# Patient Record
Sex: Female | Born: 1975
Health system: Southern US, Community
[De-identification: ages and names within clinical notes are randomized; demographics above are authoritative.]

## PROBLEM LIST (undated history)

## (undated) DIAGNOSIS — H7292 Unspecified perforation of tympanic membrane, left ear: Secondary | ICD-10-CM

## (undated) DIAGNOSIS — Z8759 Personal history of other complications of pregnancy, childbirth and the puerperium: Secondary | ICD-10-CM

## (undated) DIAGNOSIS — R519 Headache, unspecified: Secondary | ICD-10-CM

## (undated) DIAGNOSIS — F32A Depression, unspecified: Secondary | ICD-10-CM

## (undated) DIAGNOSIS — F329 Major depressive disorder, single episode, unspecified: Secondary | ICD-10-CM

## (undated) DIAGNOSIS — N83209 Unspecified ovarian cyst, unspecified side: Secondary | ICD-10-CM

## (undated) DIAGNOSIS — H9313 Tinnitus, bilateral: Secondary | ICD-10-CM

## (undated) DIAGNOSIS — R51 Headache: Secondary | ICD-10-CM

## (undated) DIAGNOSIS — R002 Palpitations: Secondary | ICD-10-CM

## (undated) DIAGNOSIS — R102 Pelvic and perineal pain: Secondary | ICD-10-CM

## (undated) DIAGNOSIS — F419 Anxiety disorder, unspecified: Secondary | ICD-10-CM

## (undated) HISTORY — DX: Palpitations: R00.2

## (undated) HISTORY — DX: Anxiety disorder, unspecified: F41.9

## (undated) HISTORY — DX: Unspecified ovarian cyst, unspecified side: N83.209

## (undated) HISTORY — DX: Major depressive disorder, single episode, unspecified: F32.9

## (undated) HISTORY — DX: Headache, unspecified: R51.9

## (undated) HISTORY — PX: SALPINGECTOMY: SHX328

## (undated) HISTORY — DX: Headache: R51

## (undated) HISTORY — DX: Depression, unspecified: F32.A

## (undated) HISTORY — DX: Tinnitus, bilateral: H93.13

## (undated) HISTORY — DX: Personal history of other complications of pregnancy, childbirth and the puerperium: Z87.59

## (undated) HISTORY — PX: OTHER SURGICAL HISTORY: SHX169

## (undated) HISTORY — DX: Unspecified perforation of tympanic membrane, left ear: H72.92

## (undated) HISTORY — DX: Pelvic and perineal pain: R10.2

---

## 2002-03-28 DIAGNOSIS — H7292 Unspecified perforation of tympanic membrane, left ear: Secondary | ICD-10-CM

## 2002-03-28 HISTORY — DX: Unspecified perforation of tympanic membrane, left ear: H72.92

## 2003-03-29 HISTORY — PX: BACK SURGERY: SHX140

## 2003-09-03 ENCOUNTER — Encounter (INDEPENDENT_AMBULATORY_CARE_PROVIDER_SITE_OTHER): Payer: Self-pay | Admitting: *Deleted

## 2003-09-03 ENCOUNTER — Observation Stay (HOSPITAL_COMMUNITY): Admission: RE | Admit: 2003-09-03 | Discharge: 2003-09-04 | Payer: Self-pay | Admitting: Specialist

## 2004-03-28 DIAGNOSIS — Z8759 Personal history of other complications of pregnancy, childbirth and the puerperium: Secondary | ICD-10-CM

## 2004-03-28 HISTORY — DX: Personal history of other complications of pregnancy, childbirth and the puerperium: Z87.59

## 2004-07-29 ENCOUNTER — Ambulatory Visit: Payer: Self-pay | Admitting: Family Medicine

## 2004-10-20 ENCOUNTER — Ambulatory Visit: Payer: Self-pay | Admitting: Nurse Practitioner

## 2004-10-21 ENCOUNTER — Emergency Department: Payer: Self-pay | Admitting: Emergency Medicine

## 2004-10-25 ENCOUNTER — Ambulatory Visit: Payer: Self-pay | Admitting: Obstetrics & Gynecology

## 2005-07-21 ENCOUNTER — Ambulatory Visit: Payer: Self-pay | Admitting: Internal Medicine

## 2005-07-26 ENCOUNTER — Ambulatory Visit: Payer: Self-pay | Admitting: Internal Medicine

## 2007-10-09 ENCOUNTER — Ambulatory Visit: Payer: Self-pay | Admitting: Family Medicine

## 2008-03-28 HISTORY — PX: KNEE SURGERY: SHX244

## 2008-06-10 ENCOUNTER — Emergency Department: Payer: Self-pay | Admitting: Emergency Medicine

## 2008-06-11 ENCOUNTER — Emergency Department (HOSPITAL_COMMUNITY): Admission: EM | Admit: 2008-06-11 | Discharge: 2008-06-11 | Payer: Self-pay | Admitting: Emergency Medicine

## 2008-06-13 ENCOUNTER — Inpatient Hospital Stay (HOSPITAL_COMMUNITY): Admission: RE | Admit: 2008-06-13 | Discharge: 2008-06-15 | Payer: Self-pay | Admitting: Orthopaedic Surgery

## 2009-09-22 ENCOUNTER — Emergency Department: Payer: Self-pay | Admitting: Internal Medicine

## 2010-06-28 ENCOUNTER — Emergency Department: Payer: Self-pay | Admitting: Internal Medicine

## 2010-07-08 LAB — CBC
HCT: 36 % (ref 36.0–46.0)
RBC: 4.01 MIL/uL (ref 3.87–5.11)

## 2010-07-08 LAB — BASIC METABOLIC PANEL
BUN: 6 mg/dL (ref 6–23)
CO2: 27 mEq/L (ref 19–32)
Chloride: 102 mEq/L (ref 96–112)
Creatinine, Ser: 0.77 mg/dL (ref 0.4–1.2)
GFR calc Af Amer: >60 mL/min (ref >60)
GFR calc non Af Amer: >60 mL/min (ref >60)
Potassium: 3.9 mEq/L (ref 3.5–5.1)

## 2010-08-10 NOTE — Op Note (Signed)
Ashley, Thornton NO.:  [id number]   MEDICAL RECORD NO.:  [id number]          PATIENT TYPE:  INP   LOCATION:  5005                         FACILITY:  [facility]   PHYSICIAN:  Vanita Panda. Magnus Ivan, M.D.DATE OF BIRTH:  1975-11-01   DATE OF PROCEDURE:  06/13/2008  DATE OF DISCHARGE:                               OPERATIVE REPORT   PREOPERATIVE DIAGNOSES:  Right distal third tibia-fibula fracture with  posterior malleolar fracture.   POSTOPERATIVE DIAGNOSES:  Right distal third tibia-fibula fracture with  posterior malleolar fracture.   PROCEDURE:  1. Open reduction and internal fixation of right ankle posterior      malleolus fracture.  2. Intramedullary nail placement, right tibia.   IMPLANTS:  Smith & Nephew 10 x 35 tibial nail with 2 proximal and 2  distal interlocks as well as a 4.0, 46-mm cannulated screw in the ankle.   SURGEON:  Vanita Panda. Magnus Ivan, MD   ASSISTANT:  Wende Neighbors, PA-C   ANESTHESIA:  General.   ANTIBIOTICS:  One gram IV Ancef.   BLOOD LOSS:  100 mL.   COMPLICATIONS:  None.   INDICATIONS:  Briefly, Ms. Ashley Thornton is a 35 year old female who on  Tuesday of this week sustained some type of mechanical fall and twisting  injury injuring her right tibia.  She sustained a distal third tibia  fracture with a proximal fibular fracture, but there also was a  posterior malleolus fracture piece.  She was apparently seen at the  [facility] and placed in a splint and given  instructions to follow up with the [facility] in [place] next  day.  After an unsuccessful attempt apparently to get through the doors  of [facility] via phone call, the patient was frustrated and came  to the [facility] Emergency Room.  Since I was on an unassigned call, I  was consulted to see her.  I then saw her in my office yesterday and set  her up for surgery today, found her to be a very pleasant individual and  recommended  intramedullary nail placement in the tibia as well as the  fixation of posterior malleolar piece.  The risks and benefits of  surgery were explained to her at length and she agrees to proceed with  surgery.   PROCEDURE DESCRIPTION:  After informed consent was obtained, the  appropriate right leg was marked.  Ms. [name] was brought to the  operating room and placed supine on the operating table.  General  anesthesia was then obtained.  Her right leg was then prepped and draped  from the thigh down to the toes with DuraPrep and sterile drapes were  applied.  A time-out was called to identify the correct patient and  correct right leg and ankle.  First, my attention was turned to the  posterior malleolar piece.  I did not want this separating with IM nail,  so I was able to under direct fluoroscopic guidance place a cannulated  screw from anterior to posterior direction after making a small incision  at the level of the ankle.  This  was again performed under direct  fluoroscopic guidance and I was able to place a partially threaded screw  to capture this piece.  Next, my attention was turned to placing an  intramedullary nail.  An incision was made at the knee between the  inferior pole of patella and the tibial tubercle.  I dissected down to  the soft tissues and identified the patellar tendon.  It divided the  peritenon and then took a medial parapatellar approach to the knee.  With the  radiolucent triangle placed under the knee, a guide pin was  inserted in an antegrade fashion from the tip of the tibial plateau down  into the tibial shaft.  This was under fluoroscopic guidance as well.  An initiating reamer was able to open up the femoral canal.  I then  placed a guidewire down this pathway crossing the fracture site with  fracture reduced into the ankle.  From this standpoint, I then  sequentially reamed the tibia from 9 mm up to 11 mm and 5 mm increments.  A good chatter was  encountered in the bone and then I was able to make a  measurement off the guide pin and chose a Smith abd Nephew 10 x 35  tibial nail.  This nail was then placed over the guidewire crossing the  fracture site into the ankle and the guidewire was removed and the  fracture was held under almost anatomically reduced position.  Using the  outrigger guide, I then made 2 separate stab incisions medial and  laterally and placed 2 proximal interlocking screws.  Then distally  through a separate stab incision, I was able to place 2 distal  interlocking screws from medial to lateral position.  Again, this was  all performed under direct fluoroscopic guidance.  I put the knee  through a range of motion and the proximal aspect the nail was not  impinging the knee joint and the ankle showed full range of motion as  well.  We then copiously irrigated all of the wounds.  The arthrotomy  was closed with #1 Vicryl in the knee, followed by 0 Vicryl deep tissue,  2-0 Vicryl in subcutaneous tissue, and interrupted staples on this knee  incision at the skin as well as all the interlocked incisions.  Xeroform  followed by well-padded sterile dressing was placed throughout the leg  and the patient was awakened, extubated, and taken to the recovery room  in stable condition.  Postoperatively, she will be admitted for physical  therapy as well as elevation, ice, and further convalescence for pain  control and antibiotics.  Likely tomorrow, I will put her in a Cam  Walker and let her perform touch down to 25% weightbearing.      Vanita Panda. Magnus Ivan, M.D.  Electronically Signed     CYB/MEDQ  D:  06/13/2008  T:  06/14/2008  Job:  [id number]   cc:   ER [facility]/Head of  [facility]

## 2010-08-13 NOTE — Op Note (Signed)
NAME:  Ashley Thornton, Ashley Thornton                         ACCOUNT NO.:  [id number]   MEDICAL RECORD NO.:  [id number]                   PATIENT TYPE:  AMB   LOCATION:  DAY                                  FACILITY:  [facility]   PHYSICIAN:  Jene Every, M.D.                 DATE OF BIRTH:  09-09-1975   DATE OF PROCEDURE:  09/03/2003  DATE OF DISCHARGE:                                 OPERATIVE REPORT   PREOPERATIVE DIAGNOSES:  1. Spinal stenosis.  2. Herniated nucleus pulposus L4-5, left.   POSTOPERATIVE DIAGNOSES:  1. Spinal stenosis.  2. Herniated nucleus pulposus L4-5, left.   PROCEDURES PERFORMED:  1. Microdiskectomy L4-5, left.  2. Lateral recess decompression at L4-5 with foraminotomy of L4.  3. Decompression of the L5 nerve root.   ANESTHESIA:  General.   SURGEON:  Javier Docker, M.D.   ASSISTANT:  Roma Schanz, P.A.   BRIEF HISTORY AND INDICATIONS:  This is a 35 year old female with refractory  left lower extremity radicular pain, L5 nerve root distribution.  Positive  neural tension sign, __________HL weakness, and dysesthesias in the L5  dermatome, MRI indicating a large HNP, paracentral to the left with  compression of the L5 nerve root.  The patient had failed conservative  treatment including rest, anti-inflammatories, activity modification,  persistent neural tension signs, and neural deficit.  Operative intervention  is indicated for decompression of the L5 nerve root.  Risks and benefits  discussed including bleeding, infection, damage to neurovascular structures,  CSF leakage, epidural fibrosis, adjacent segment disease, etc.   TECHNIQUE:  The patient in supine position.  After the induction of adequate  general anesthesia and 1 g of Kefzol, she was placed prone on the [place]  frame.  All bony prominences well-padded.  Lumbar region is prepped and  draped in the usual sterile fashion.  Two 18 gauge spinal needles were used  to localize the 4-5 interspace  confirmed with x-ray.  An incision was made  from the spinous process of 4-5.  Subcutaneous tissue was dissected.  Electrocautery was utilized to achieve hemostasis.  The dorsolumbar fascia  identified and divided in line with the skin incision.  Paraspinous muscle  elevated from the lamina of 4 and 5.  McCullough retractor was placed,  Penfield 4 in the interlaminar space, second confirmatory radiograph  obtained.  Operating microscope was draped and brought into the surgical  field.  Hemilaminotomy of the caudad edge of 4 was performed with the 2 and  3 mm Kerrison.  Ligamentum flavum detached from the cephalad edge of 5 with  a straight curette.  Hemilaminotomy of the cephalad edge of 5 was performed  with a 2 and a 3 mm Kerrison, performing an L5 foraminotomy.  The L5 nerve  root was identified in the foramen and gently mobilized medially.  There was  significant tension compressing the lateral recess of that.  Extensive  epidural  venous plexus was lysed with bipolar electrocautery.  HNP was  noted, and an annulotomy was performed, and copious large fragments of disk  material was removed from the subannular space.  This was mobilized with a  hockey-stick, Epstein, and retrieved piecemeal with an upbiting and a  straight pituitary and a micropituitary as well.  Following this full  diskectomy, I then examined the axilla beneath the nerve root in the foramen  of 4 and 5, and they were all found to be widely patent without residual  disk herniation.  I then copiously irrigated the disk space, inspected it,  and saw no evidence of CSF leakage or active bleeding.  There was good  excursion of the root at least 1 cm medial to the pedicle without tension.   Next, retractor and instrumentation was removed.  Paraspinous muscles  inspected with no evidence of active bleeding.  Dorsolumbar fascia  reapproximated with #1 Vicryl interrupted figure-of-eight sutures,  subcutaneous tissue  reapproximated with 2-0 Vicryl simple sutures.  Skin was  reapproximated with 4-0 subcuticular Prolene.  Wound was reinforced with  Steri-Strips.  Sterile dressing applied, placed supine on the hospital bed,  extubated without difficulty, and transported to the recovery room in  satisfactory condition.   The patient tolerated the procedure well with no apparent complications.  Minimal blood loss.                                               Jene Every, M.D.    [name]  D:  09/03/2003  T:  09/03/2003  Job:  [id number]

## 2010-08-13 NOTE — Discharge Summary (Signed)
NAMEDEAJA, RIZO NO.:  [id number]   MEDICAL RECORD NO.:  [id number]          PATIENT TYPE:  INP   LOCATION:  5005                         FACILITY:  [facility]   PHYSICIAN:  Vanita Panda. Magnus Ivan, M.D.DATE OF BIRTH:  07/11/1975   DATE OF ADMISSION:  06/13/2008  DATE OF DISCHARGE:  06/15/2008                               DISCHARGE SUMMARY   ADMITTING DIAGNOSIS:  Right tibia shaft/fibula shaft fracture.   DISCHARGE DIAGNOSIS:  Right tibia-fibular fracture.   HOSPITAL COURSE:  Ms. Ashley Thornton is a 35 year old female who was admitted  on June 13, 2008 after having a mechanical fall.  She was actually  originally seen on Tuesday of the week when she was admitted and she  sustained a distal third tibia fracture with a proximal fibular  fracture.  There was also a small posterior malleolar fracture piece.  She was apparently seen at [facility] and placed  in a splint and given instructions to followup with the [facility]  in [place] the next day.  After an unsuccessful attempt apparently to  get through the doors of the [facility] via phone call, the patient  was frustrated and came to the [facility] Emergency Room.  Since I was  on an unassigned call, I was consulted to see her.  I then saw her in my  office yesterday, the day before admission and set her up for surgery  today.  She was found to be a very pleasant individual and I recommended  intramedullary nailing of the tibia as well as fixation of the posterior  malleolar piece.  The risks and benefits of surgery were explained to  her and she agreed to proceed with surgery.  On the day of admission,  she was taken to the operating room and an intramedullary nail was  placed without complications.  Also, I had to place a screw in the  distal tibia so as to not displace the posterior malleolus piece.  For  details of description of the operation, please see dictated operative  note in the patient's medical record.   Following surgery, the patient was admitted to regular orthopedic floor  bed and had an uneventful hospital stay.  She was placed in a Cam walker  and instructed for touchdown weightbearing in the Cam walker.  After  being seen and cleared by Physical Therapy, she was transitioned no oral  pain medicine as well as oral diet and she tolerated this well.   DISPOSITION:  To home.   DISCHARGE MEDICATIONS:  1. Oxycodone.  2. Phenergan.  3. Flexeril.  4. Darvocet.   DISCHARGE INSTRUCTIONS:  While she is at home, she will continue to work  on touchdown weightbearing in her Cam walker.  A followup appointment  will be established in my office in 2 weeks.      Vanita Panda. Magnus Ivan, M.D.  Electronically Signed     CYB/MEDQ  D:  08/03/2008  T:  08/03/2008  Job:  [id number]

## 2011-02-04 ENCOUNTER — Emergency Department: Payer: Self-pay | Admitting: Emergency Medicine

## 2012-11-14 ENCOUNTER — Emergency Department: Payer: Self-pay | Admitting: Emergency Medicine

## 2014-02-07 ENCOUNTER — Encounter: Payer: Self-pay | Admitting: *Deleted

## 2014-02-10 ENCOUNTER — Ambulatory Visit (INDEPENDENT_AMBULATORY_CARE_PROVIDER_SITE_OTHER): Payer: 59 | Admitting: Cardiovascular Disease

## 2014-02-10 ENCOUNTER — Encounter (INDEPENDENT_AMBULATORY_CARE_PROVIDER_SITE_OTHER): Payer: Self-pay

## 2014-02-10 ENCOUNTER — Encounter: Payer: Self-pay | Admitting: Cardiovascular Disease

## 2014-02-10 VITALS — BP 122/80 | HR 72 | Ht 71.0 in | Wt 178.8 lb

## 2014-02-10 DIAGNOSIS — R002 Palpitations: Secondary | ICD-10-CM

## 2014-02-10 DIAGNOSIS — R0789 Other chest pain: Secondary | ICD-10-CM

## 2014-02-10 DIAGNOSIS — Z720 Tobacco use: Secondary | ICD-10-CM

## 2014-02-10 NOTE — Patient Instructions (Signed)
Your physician has recommended that you wear a holter monitor. Holter monitors are medical devices that record the heart's electrical activity. Doctors most often use these monitors to diagnose arrhythmias. Arrhythmias are problems with the speed or rhythm of the heartbeat. The monitor is a small, portable device. You can wear one while you do your normal daily activities. This is usually used to diagnose what is causing palpitations/syncope (passing out).  Your physician has requested that you have an exercise tolerance test. For further information please visit [email or web address]. Please also follow instruction sheet, as given.  Your physician recommends that you schedule a follow-up appointment in:  As needed with Dr. Kirke CorinArida

## 2014-02-10 NOTE — Assessment & Plan Note (Signed)
I had a prolonged discussion with her about the importance of smoking cessation. 

## 2014-02-10 NOTE — Assessment & Plan Note (Signed)
Her symptoms are suggestive of premature beats. Physical exam is unremarkable with no cardiac murmurs. EKG is normal. I requested a 48-hour Holter monitor for evaluation. I strongly advised her to cut down on caffeine intake. Some of her symptoms might also be triggered by anxiety.

## 2014-02-10 NOTE — Progress Notes (Signed)
  Primary care physician: Dr. Nicholaus CorollaFarah Khan  HPI  This is a pleasant 38 year old female who was referred for evaluation of chest pain and palpitations. She has no previous cardiac history and reports no chronic medical conditions other than tobacco use. She currently smokes 2 cigars per day. She has been experiencing intermittent episodes of palpitations and skipping in her heart since March. This is mostly noticeable when she is under stress and not with physical activities. She denies any dizziness, syncope or presyncope. She also complains of substernal chest tightness which usually happens when she is under stress but not with physical activities. She has chronic exertional dyspnea with no recent worsening. She has no family history of premature coronary artery disease, arrhythmia or sudden death. She consumes one cup of coffee a day and at least 2  20 ounce bottles of [facility] Dew.   Allergies  Allergen Reactions  . Codeine     SOB, SWEATING, FAINT     No current outpatient prescriptions on file prior to visit.   No current facility-administered medications on file prior to visit.     Past Medical History  Diagnosis Date  . Anxiety   . Heart palpitations   . Ovarian cyst     right  . Pelvic pain in female   . Depression   . Headache      No past surgical history on file.   No family history on file.   History   Social History  . Marital Status: Single    Spouse Name: N/A    Number of Children: N/A  . Years of Education: N/A   Occupational History  . Not on file.   Social History Main Topics  . Smoking status: Current Some Day Smoker  . Smokeless tobacco: Not on file  . Alcohol Use: No  . Drug Use: No  . Sexual Activity: Not on file   Other Topics Concern  . Not on file   Social History Narrative     ROS A 10 point review of system was performed. It is negative other than that mentioned in the history of present illness.   PHYSICAL EXAM   BP  122/80 mmHg  Pulse 72  Ht 5\' 11"  (1.803 m)  Wt 178 lb 12.8 oz (81.103 kg)  BMI 24.95 kg/m2 Constitutional: She is oriented to person, place, and time. She appears well-developed and well-nourished. No distress.  HENT: No nasal discharge.  Head: Normocephalic and atraumatic.  Eyes: Pupils are equal and round. No discharge.  Neck: Normal range of motion. Neck supple. No JVD present. No thyromegaly present.  Cardiovascular: Normal rate, regular rhythm, normal heart sounds. Exam reveals no gallop and no friction rub. No murmur heard.  Pulmonary/Chest: Effort normal and breath sounds normal. No stridor. No respiratory distress. She has no wheezes. She has no rales. She exhibits no tenderness.  Abdominal: Soft. Bowel sounds are normal. She exhibits no distension. There is no tenderness. There is no rebound and no guarding.  Musculoskeletal: Normal range of motion. She exhibits no edema and no tenderness.  Neurological: She is alert and oriented to person, place, and time. Coordination normal.  Skin: Skin is warm and dry. No rash noted. She is not diaphoretic. No erythema. No pallor.  Psychiatric: She has a normal mood and affect. Her behavior is normal. Judgment and thought content normal.    ZOX:WRUEAVEKG:normal sinus rhythm with no significant ST or T wave changes.   ASSESSMENT AND PLAN

## 2014-02-10 NOTE — Assessment & Plan Note (Signed)
Her chest pain is overall atypical and she is a smoker. I requested a treadmill stress test for evaluation.

## 2014-03-06 ENCOUNTER — Telehealth: Payer: Self-pay | Admitting: *Deleted

## 2014-03-06 NOTE — Telephone Encounter (Signed)
LVM to inform patient her holter monitor showed: NSR with sinus tachycardia  No significant arrhythmia  Per Dr. Kirke CorinArida

## 2014-03-07 ENCOUNTER — Encounter: Payer: 59 | Admitting: Cardiovascular Disease

## 2014-03-10 NOTE — Telephone Encounter (Signed)
Reviewed results with patient. 

## 2014-03-13 ENCOUNTER — Other Ambulatory Visit: Payer: Self-pay

## 2014-03-13 ENCOUNTER — Ambulatory Visit (INDEPENDENT_AMBULATORY_CARE_PROVIDER_SITE_OTHER): Payer: 59

## 2014-03-13 ENCOUNTER — Ambulatory Visit (INDEPENDENT_AMBULATORY_CARE_PROVIDER_SITE_OTHER): Payer: 59 | Admitting: Cardiovascular Disease

## 2014-03-13 DIAGNOSIS — R002 Palpitations: Secondary | ICD-10-CM

## 2014-03-13 DIAGNOSIS — R0789 Other chest pain: Secondary | ICD-10-CM

## 2014-03-13 NOTE — Procedures (Signed)
   Treadmill Stress test  Indication: Atypical chest pain.  Baseline Data:  Resting EKG shows NSR with rate of 101 bpm, nonspecific T wave changes Resting blood pressure of 129/81 mm Hg Stand bruce protocal was used.  Exercise Data:  Patient exercised for 9 min 41 sec,  Peak heart rate of 160 bpm.  This was 86 % of the maximum predicted heart rate. No symptoms of chest pain or lightheadedness were reported at peak stress or in recovery.  Peak Blood pressure recorded was 195/76 Maximal work level: 11 METs.  Heart rate at 3 minutes in recovery was 100 bpm. BP response: Hypertensive HR response: Normal  EKG with Exercise: Sinus tachycardia with no significant ST changes  FINAL IMPRESSION: Normal exercise stress test. No significant EKG changes concerning for ischemia. Good exercise tolerance.  Recommendation: The chest pain is likely noncardiac.

## 2014-03-13 NOTE — Patient Instructions (Signed)
Normal stress test

## 2015-08-03 ENCOUNTER — Emergency Department
Admission: EM | Admit: 2015-08-03 | Discharge: 2015-08-03 | Disposition: A | Discharge status: Home / Self Care | Payer: Self-pay | Attending: Emergency Medicine | Admitting: Emergency Medicine

## 2015-08-03 ENCOUNTER — Encounter: Payer: Self-pay | Admitting: Emergency Medicine

## 2015-08-03 DIAGNOSIS — M545 Low back pain, unspecified: Secondary | ICD-10-CM

## 2015-08-03 DIAGNOSIS — F329 Major depressive disorder, single episode, unspecified: Secondary | ICD-10-CM | POA: Insufficient documentation

## 2015-08-03 DIAGNOSIS — F17200 Nicotine dependence, unspecified, uncomplicated: Secondary | ICD-10-CM | POA: Insufficient documentation

## 2015-08-03 LAB — URINALYSIS COMPLETE WITH MICROSCOPIC (ARMC ONLY)
BILIRUBIN URINE: NEGATIVE
Bacteria, UA: NONE SEEN
GLUCOSE, UA: NEGATIVE mg/dL
Hgb urine dipstick: NEGATIVE
KETONES UR: NEGATIVE mg/dL
Leukocytes, UA: NEGATIVE
Nitrite: NEGATIVE
PROTEIN: NEGATIVE mg/dL
Specific Gravity, Urine: 1.02 (ref 1.005–1.030)
pH: 6 (ref 5.0–8.0)

## 2015-08-03 LAB — POCT PREGNANCY, URINE: Preg Test, Ur: NEGATIVE

## 2015-08-03 MED ORDER — TRAMADOL HCL 50 MG PO TABS: 50.0000 mg | ORAL_TABLET | Freq: Four times a day (QID) | ORAL | Status: DC | PRN

## 2015-08-03 MED ORDER — CYCLOBENZAPRINE HCL 10 MG PO TABS: 10.0000 mg | ORAL_TABLET | Freq: Three times a day (TID) | ORAL | Status: DC | PRN

## 2015-08-03 NOTE — ED Notes (Signed)
Reports right lower back pain.  Hx of the same.  Ambulates well. States its hard to sleeping at night bc of the pain

## 2015-08-03 NOTE — ED Notes (Signed)
States she developed right lower back pain couple of days ago  States pain is radiating into right leg  Denies any injury  Ambulates well but is unable to sleep d/t pain

## 2015-08-03 NOTE — ED Provider Notes (Signed)
[facility] Emergency Department Provider Note ____________________________________________  Time seen: Approximately 6:08 PM  I have reviewed the triage vital signs and the nursing notes.   HISTORY  Chief Complaint Back Pain    HPI Ashley Thornton is a 40 y.o. female who presents to the emergency department for evaluation of right mid/lower back pain for the past few days. She reports having a history of chronic back pain, but this is different. She states that it is preventing her from sleeping. She states the pain is now starting to come around to her abdomen. She reports that she has a strange back pain every month just before starting her menstrual cycle and this may be the cause of the current pain, however it is different this month than last. She has had no relief with ibuprofen.   Past Medical History  Diagnosis Date  . Anxiety   . Heart palpitations   . Ovarian cyst     right  . Pelvic pain in female   . Depression   . Headache     Patient Active Problem List   Diagnosis Date Noted  . Palpitations 02/10/2014  . Other chest pain 02/10/2014  . Tobacco use 02/10/2014    History reviewed. No pertinent past surgical history.  Current Outpatient Rx  Name  Route  Sig  Dispense  Refill  . cyclobenzaprine (FLEXERIL) 10 MG tablet   Oral   Take 1 tablet (10 mg total) by mouth 3 (three) times daily as needed for muscle spasms.   30 tablet   0   . ibuprofen (V-R IBUPROFEN JR) 100 MG tablet   Oral   Take 200 mg by mouth as needed.         . traMADol (ULTRAM) 50 MG tablet   Oral   Take 1 tablet (50 mg total) by mouth every 6 (six) hours as needed.   12 tablet   0     Allergies Codeine  No family history on file.  Social History Social History  Substance Use Topics  . Smoking status: Current Some Day Smoker  . Smokeless tobacco: None  . Alcohol Use: No    Review of Systems Constitutional: No recent illness. Cardiovascular:  Denies chest pain or palpitations. Respiratory: Denies shortness of breath. Musculoskeletal: Pain in right mid/lower back. Skin: Negative for rash, wound, lesion. Neurological: Negative for focal weakness or numbness.  ____________________________________________   PHYSICAL EXAM:  VITAL SIGNS: ED Triage Vitals  Enc Vitals Group     BP 08/03/15 1659 131/86 mmHg     Pulse Rate 08/03/15 1659 72     Resp 08/03/15 1659 18     Temp 08/03/15 1659 98.2 F (36.8 C)     Temp Source 08/03/15 1659 Oral     SpO2 08/03/15 1659 100 %     Weight 08/03/15 1659 175 lb (79.379 kg)     Height 08/03/15 1659 5\' 7"  (1.702 m)     Head Cir --      Peak Flow --      Pain Score 08/03/15 1659 8     Pain Loc --      Pain Edu? --      Excl. in GC? --     Constitutional: Alert and oriented. Well appearing and in no acute distress. Eyes: Conjunctivae are normal. EOMI. Head: Atraumatic. Neck: No stridor.  Respiratory: Normal respiratory effort.   Musculoskeletal: Full ROM throughout. No CVA tenderness. Neurologic:  Normal speech and language. No  gross focal neurologic deficits are appreciated. Speech is normal. No gait instability. Skin:  Skin is warm, dry and intact. Atraumatic. Psychiatric: Mood and affect are normal. Speech and behavior are normal.  ____________________________________________   LABS (all labs ordered are listed, but only abnormal results are displayed)  Labs Reviewed  URINALYSIS COMPLETEWITH MICROSCOPIC ([facility] ONLY) - Abnormal; Notable for the following:    Color, Urine YELLOW (*)    APPearance CLEAR (*)    Squamous Epithelial / LPF 6-30 (*)    All other components within normal limits  POC URINE PREG, ED  POCT PREGNANCY, URINE   ____________________________________________  RADIOLOGY  Not indicated ____________________________________________   PROCEDURES  Procedure(s) performed: None   ____________________________________________   INITIAL IMPRESSION /  ASSESSMENT AND PLAN / ED COURSE  Pertinent labs & imaging results that were available during my care of the patient were reviewed by me and considered in my medical decision making (see chart for details).  Patient was given prescriptions for Flexeril and tramadol. She was encouraged to schedule a follow-up appointment with the OB/GYN of her choice to further investigate the pain in relation to her menstrual cycles. She was advised to return to the emergency department for symptoms that change or worsen if she is unable schedule an appointment. ____________________________________________   FINAL CLINICAL IMPRESSION(S) / ED DIAGNOSES  Final diagnoses:  Right-sided low back pain without sciatica       Chinita Pester, FNP 08/04/15 0017  Loleta Rose, MD 08/04/15 [phone]

## 2015-08-03 NOTE — Discharge Instructions (Signed)

## 2015-08-03 NOTE — ED Notes (Signed)
Lab called to check on UA results - they stated the results should be seen in 2 minutes

## 2016-05-16 DIAGNOSIS — M546 Pain in thoracic spine: Secondary | ICD-10-CM | POA: Diagnosis not present

## 2016-07-15 ENCOUNTER — Ambulatory Visit (INDEPENDENT_AMBULATORY_CARE_PROVIDER_SITE_OTHER): Payer: Self-pay | Admitting: Nurse Practitioner

## 2016-07-15 ENCOUNTER — Encounter: Payer: Self-pay | Admitting: Nurse Practitioner

## 2016-07-15 VITALS — BP 116/83 | HR 76 | Temp 99.1°F | Resp 16 | Ht 71.0 in | Wt 192.0 lb

## 2016-07-15 DIAGNOSIS — M542 Cervicalgia: Secondary | ICD-10-CM | POA: Diagnosis not present

## 2016-07-15 MED ORDER — GABAPENTIN 100 MG PO CAPS: 100.0000 mg | ORAL_CAPSULE | Freq: Three times a day (TID) | ORAL | 1 refills | Status: DC
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
## 2016-07-15 NOTE — Patient Instructions (Addendum)
Ashley Thornton, Thank you for coming in to clinic today.  1. Environmental health practitioner: contact them for the proper stool for your computer workstation.  I can complete paperwork if required 2. START gabapentin 100 mg.  Take 1 tablet daily until you know how it affects you.  It may cause drowsiness, so take it when you are not driving.  Increase by 1 tablet daily as needed until a maximum of 3 tablets daily. 3. Consider occupational therapy for treatment of neck pain. 4.  Start taking Tylenol extra strength 1 to 2 tablets every 6-8 hours for aches or fever/chills for next few days as needed.  Do not take more than 3,000 mg in 24 hours from all medicines.  May take naproxen sodium as well if tolerated 440 mg every 12 hours as needed.  Alternate dosing in the same day is ok. - Use heat and ice.  Apply this for 15 minutes at a time 6-8 times per day.   - Muscle rub with lidocaine.  Avoid using this with heat and ice to avoid burns.  Please schedule a follow-up appointment with [name], AGNP in 1 month.   If you have any other questions or concerns, please feel free to call the clinic or send a message through MyChart. You may also schedule an earlier appointment if necessary.  [name], DNP, AGNP-BC Adult Gerontology Nurse Practitioner [facility], [facility]   Musculoskeletal Pain Musculoskeletal pain is muscle and bone aches and pains. This pain can occur in any part of the body. Follow these instructions at home:  Only take medicines for pain, discomfort, or fever as told by your health care provider.  You may continue all activities unless the activities cause more pain. When the pain lessens, slowly resume normal activities. Gradually increase the intensity and duration of the activities or exercise.  During periods of severe pain, bed rest may be helpful. Lie or sit in any position that is comfortable, but get out of bed and walk around at least every several hours.  If  directed, put ice on the injured area.  Put ice in a plastic bag.  Place a towel between your skin and the bag.  Leave the ice on for 20 minutes, 2-3 times a day. Contact a health care provider if:  Your pain is getting worse.  Your pain is not relieved with medicines.  You lose function in the area of the pain if the pain is in your arms, legs, or neck. This information is not intended to replace advice given to you by your health care provider. Make sure you discuss any questions you have with your health care provider. Document Released: 03/14/2005 Document Revised: 08/25/2015 Document Reviewed: 11/16/2012 Elsevier Interactive Patient Education  2017 [employer].

## 2016-07-18 NOTE — Progress Notes (Signed)
I have reviewed this encounter including the documentation in this note and/or discussed this patient with the provider, Lauren Kennedy, AGPCNP-BC. I am certifying that I agree with the content of this note as supervising physician.  Alexander Karamalegos, DO [facility] 07/18/2016, 3:04 PM 

## 2016-08-15 ENCOUNTER — Encounter: Payer: 59 | Admitting: Nurse Practitioner

## 2016-10-31 ENCOUNTER — Emergency Department
Admission: EM | Admit: 2016-10-31 | Discharge: 2016-10-31 | Disposition: A | Discharge status: Home / Self Care | Payer: 59 | Attending: Emergency Medicine | Admitting: Emergency Medicine

## 2016-10-31 ENCOUNTER — Emergency Department: Payer: 59

## 2016-10-31 DIAGNOSIS — Y999 Unspecified external cause status: Secondary | ICD-10-CM | POA: Diagnosis not present

## 2016-10-31 DIAGNOSIS — M542 Cervicalgia: Secondary | ICD-10-CM | POA: Diagnosis not present

## 2016-10-31 DIAGNOSIS — Z79899 Other long term (current) drug therapy: Secondary | ICD-10-CM | POA: Diagnosis not present

## 2016-10-31 DIAGNOSIS — W2203XA Walked into furniture, initial encounter: Secondary | ICD-10-CM | POA: Insufficient documentation

## 2016-10-31 DIAGNOSIS — S161XXA Strain of muscle, fascia and tendon at neck level, initial encounter: Secondary | ICD-10-CM | POA: Diagnosis not present

## 2016-10-31 DIAGNOSIS — F17210 Nicotine dependence, cigarettes, uncomplicated: Secondary | ICD-10-CM | POA: Diagnosis not present

## 2016-10-31 DIAGNOSIS — Y929 Unspecified place or not applicable: Secondary | ICD-10-CM | POA: Insufficient documentation

## 2016-10-31 DIAGNOSIS — Y939 Activity, unspecified: Secondary | ICD-10-CM | POA: Diagnosis not present

## 2016-10-31 DIAGNOSIS — M62838 Other muscle spasm: Secondary | ICD-10-CM | POA: Diagnosis not present

## 2016-10-31 DIAGNOSIS — S199XXA Unspecified injury of neck, initial encounter: Secondary | ICD-10-CM | POA: Diagnosis present

## 2016-10-31 MED ORDER — CYCLOBENZAPRINE HCL 10 MG PO TABS
5.0000 mg | ORAL_TABLET | Freq: Once | ORAL | Status: AC
  Administered 2016-10-31: 5 mg via ORAL
  Filled 2016-10-31: qty 1

## 2016-10-31 MED ORDER — PREDNISONE 10 MG (21) PO TBPK: ORAL_TABLET | ORAL | 0 refills | Status: DC

## 2016-10-31 MED ORDER — CYCLOBENZAPRINE HCL 10 MG PO TABS: 10.0000 mg | ORAL_TABLET | Freq: Three times a day (TID) | ORAL | 0 refills | Status: DC | PRN

## 2016-10-31 MED ORDER — DEXAMETHASONE SODIUM PHOSPHATE 10 MG/ML IJ SOLN
10.0000 mg | Freq: Once | INTRAMUSCULAR | Status: AC
  Administered 2016-10-31: 10 mg via INTRAMUSCULAR
  Filled 2016-10-31: qty 1

## 2016-10-31 NOTE — Discharge Instructions (Signed)
Take medication as prescribed. Return to emergency department if symptoms worsen and follow-up with PCP as needed.   °

## 2016-10-31 NOTE — ED Notes (Signed)
Pt states Thursday she developed right neck pain, states since Thursday her neck pain has now spread to the left side of her shoulders, awake and alert in no acute distress

## 2016-10-31 NOTE — ED Provider Notes (Signed)
[facility] Emergency Department Provider Note   ____________________________________________   I have reviewed the triage vital signs and the nursing notes.   HISTORY  Chief Complaint Neck Pain    HPI Ashley Thornton is a 41 y.o. female presents to the emergency department with bilateral posterior neck pain she describes as sharp and spasming. Patient reports she woke up with the pain 4 days ago and she describes it as a "crink in her neck". Patient also reports hitting her head 3 days ago on a piece of furniture and is unsure if this is related to current symptoms. Patient denies any loss of consciousness or neurological symptoms related to hitting her head. Patient reports increased symptoms with cervical rotation and extension. Patient denies any radicular symptoms associated with the above symptoms. Patient reports a history of osteoarthritis of the cervical spine. Patient denies fever, chills, headache, vision changes, chest pain, chest tightness, shortness of breath, abdominal pain, nausea and vomiting.  Past Medical History:  Diagnosis Date  . Anxiety   . Depression   . Headache   . Heart palpitations   . History of ectopic pregnancy 2006   single fallopian tube remains  . Ovarian cyst    right  . Pelvic pain in female   . Ruptured ear drum, left 2004   hearing returned  . Tinnitus aurium, bilateral     Patient Active Problem List   Diagnosis Date Noted  . Palpitations 02/10/2014  . Other chest pain 02/10/2014  . Tobacco use 02/10/2014    Past Surgical History:  Procedure Laterality Date  . BACK SURGERY  2005   L4-L5 slipped disc repair  . KNEE SURGERY Right 2010   Broken knee, leg, ankle - rods and screws remain    Prior to Admission medications   Medication Sig Start Date End Date Taking? Authorizing Provider  cyclobenzaprine (FLEXERIL) 10 MG tablet Take 1 tablet (10 mg total) by mouth 3 (three) times daily as needed for muscle  spasms. Take 0.5 or 1 tablet (10 mg total) by mouth 3 (three) times daily as needed for muscle spasms. 10/31/16   Kimyatta Lecy M, PA-C  gabapentin (NEURONTIN) 100 MG capsule Take 1 capsule (100 mg total) by mouth 3 (three) times daily. 07/15/16   Galen Manila, NP  naproxen sodium (ANAPROX) 220 MG tablet Take 220 mg by mouth as needed.    [provider]  predniSONE (STERAPRED UNI-PAK 21 TAB) 10 MG (21) TBPK tablet Take 6 tablets on day 1. Take 5 tablets on day 2. Take 4 tablets on day 3. Take 3 tablets on day 4. Take 2 tablets on day 5. Take 1 tablets on day 6. 10/31/16   Jakarius Flamenco M, PA-C    Allergies Codeine  No family history on file.  Social History Social History  Substance Use Topics  . Smoking status: Current Every Day Smoker    Types: Cigarettes  . Smokeless tobacco: Never Used     Comment: Smokes 3 black and milds every day  . Alcohol use Yes     Comment: 3x monthly, less than 4 drinks per occurrence    Review of Systems Constitutional: Negative for fever/chills Eyes: No visual changes. ENT:  Negative for sore throat and for difficulty swallowing Cardiovascular: Denies chest pain. Respiratory: Denies cough. Denies shortness of breath. Gastrointestinal: No abdominal pain.  No nausea, vomiting, diarrhea. Genitourinary: Negative for dysuria. Musculoskeletal: Positive for neck pain. Skin: Negative for rash. Neurological: Negative for headaches.  Negative focal weakness or numbness. Negative for loss of consciousness. Able to ambulate. ____________________________________________   PHYSICAL EXAM:  VITAL SIGNS: ED Triage Vitals [10/31/16 1633]  Enc Vitals Group     BP (!) 151/88     Pulse Rate 80     Resp 16     Temp 99.6 F (37.6 C)     Temp Source Oral     SpO2 100 %     Weight      Height      Head Circumference      Peak Flow      Pain Score 8     Pain Loc      Pain Edu?      Excl. in GC?     Constitutional: Alert and oriented. Well  appearing and in no acute distress.  Eyes: Conjunctivae are normal. PERRL. EOMI  Head: Normocephalic and atraumatic. ENT:      Ears: Canals clear. TMs intact bilaterally.      Nose: No congestion/rhinnorhea.      Mouth/Throat: Mucous membranes are moist.  Neck:Supple. Skull skeletal neck pain that increases with movement. Cardiovascular: Normal rate, regular rhythm. Normal S1 and S2.  Good peripheral circulation. Respiratory: Normal respiratory effort without tachypnea or retractions. Lungs CTAB. Good air entry to the bases with no decreased or absent breath sounds. Hematological/Lymphatic/Immunological: No cervical lymphadenopathy. Cardiovascular: Normal rate, regular rhythm. Normal distal pulses. Respiratory: Normal respiratory effort. No wheezes/rales/rhonchi. Lungs CTAB with no W/R/R. Gastrointestinal: Bowel sounds 4 quadrants. Soft and nontender to palpation. No guarding or rigidity. No palpable masses. No distention. No CVA tenderness. Musculoskeletal: Negative palpable tenderness along cervical spinous processes. Palpable tenderness along cervical paraspinals, bilateral upper trapezius, bilateral SCM. Negative radiculopathy. No deformities noted. Nontender with normal range of motion in all extremities. Neurologic: Normal speech and language. No gross focal neurologic deficits are appreciated. No gait instability. Cranial nerves: II-X intact. No sensory loss or abnormal reflexes.  Skin:  Skin is warm, dry and intact. No rash noted. Psychiatric: Mood and affect are normal. Speech and behavior are normal. Patient exhibits appropriate insight and judgement.  ____________________________________________   LABS (all labs ordered are listed, but only abnormal results are displayed)  Labs Reviewed - No data to  display ____________________________________________  EKG None ____________________________________________  RADIOLOGY None ____________________________________________   PROCEDURES  Procedure(s) performed: no    Critical Care performed: no ____________________________________________   INITIAL IMPRESSION / ASSESSMENT AND PLAN / ED COURSE  Pertinent labs & imaging results that were available during my care of the patient were reviewed by me and considered in my medical decision making (see chart for details).   Patient presents to emergency department with cervical musculoskeletal pain without traumatic injury. History and physical exam findings are reassuring symptoms are consistent with cervical neck pain related to muscle spasms. Patient noted improvement of symptoms following Decadron 10 mg IM and Flexeril 5 mg during the course of care in the emergency department. Patient will be prescribed prednisone taper and Flexeril 5 mg as needed for muscle spasms. Patient advised to follow up with PCP as needed or return to the emergency department if symptoms return or worsen. Patient informed of clinical course, understand medical decision-making process, and agree with plan.      ____________________________________________   FINAL CLINICAL IMPRESSION(S) / ED DIAGNOSES  Final diagnoses:  Muscle spasms of neck  Acute strain of neck muscle, initial encounter       NEW MEDICATIONS STARTED DURING THIS VISIT:  Discharge Medication  List as of 10/31/2016  6:09 PM    START taking these medications   Details  cyclobenzaprine (FLEXERIL) 10 MG tablet Take 1 tablet (10 mg total) by mouth 3 (three) times daily as needed for muscle spasms. Take 0.5 or 1 tablet (10 mg total) by mouth 3 (three) times daily as needed for muscle spasms., Starting Mon 10/31/2016, Print    predniSONE (STERAPRED UNI-PAK 21 TAB) 10 MG (21) TBPK tablet Take 6 tablets on day 1. Take 5 tablets on day 2. Take 4  tablets on day 3. Take 3 tablets on day 4. Take 2 tablets on day 5. Take 1 tablets on day 6., Print         Note:  This document was prepared using Dragon voice recognition software and may include unintentional dictation errors.    Melvin Marmo, Jordan Likesraci M, PA-C 10/31/16 2254    Clois ComberLittle, Dim Meisinger M, PA-C 10/31/16 2308    Sharyn CreamerQuale, Mark, MD 11/01/16 [phone]

## 2016-10-31 NOTE — ED Triage Notes (Addendum)
Patient reports waking up Thursday and states "I felt like I had a crook in my neck. As the day went on the pain got worse. I have tried green alcohol, warm compress, and OTC meds with relief" Patient reports also hitting her head (L forehead) on a piece of furniture by accident 3 days ago. Denies taking blood thinners. Patient believes she has a pinched nerve in her L shoulder. Patient denies dizziness or light headedness. HX arthritis

## 2016-11-08 ENCOUNTER — Telehealth: Payer: Self-pay

## 2016-11-08 NOTE — Telephone Encounter (Signed)
Pt called complaining bloody stool. She seems to think it might be related to her Prednisone. She does admit having hemorrhoids and states they are currently inflamed. I scheduled the pt to come in on Friday.

## 2016-11-11 ENCOUNTER — Encounter: Payer: Self-pay | Admitting: Nurse Practitioner

## 2016-11-11 ENCOUNTER — Ambulatory Visit (INDEPENDENT_AMBULATORY_CARE_PROVIDER_SITE_OTHER): Payer: 59 | Admitting: Nurse Practitioner

## 2016-11-11 VITALS — BP 123/80 | HR 83 | Temp 98.7°F | Ht 71.0 in | Wt 191.6 lb

## 2016-11-11 DIAGNOSIS — K922 Gastrointestinal hemorrhage, unspecified: Secondary | ICD-10-CM | POA: Diagnosis not present

## 2016-11-11 NOTE — Patient Instructions (Addendum)
Rael, Thank you for coming in to clinic today.  1. FOR BM: Pears, prunes, apples, blackberries, dietary fiber - Take metamucil or miralax for bulk forming.   Senokot - stool softener as needed.  Dulcolax suppository once if no BM in another 2 days (4 days total).   2.Labs Monday for blood counts.   3. If more bright red blood stool this weekend, GO to the ER.   Please schedule a follow-up appointment with [name], AGNP.  Return 7-10 days if symptoms worsen or fail to improve.  If you have any other questions or concerns, please feel free to call the clinic or send a message through MyChart. You may also schedule an earlier appointment if necessary.  You will receive a survey after today's visit either digitally by e-mail or paper by [employer]. Your experiences and feedback matter to [place].  Please respond so we know how we are doing as we provide care for you.   [name], DNP, AGNP-BC Adult Gerontology Nurse Practitioner [facility], [facility]

## 2016-11-11 NOTE — Progress Notes (Signed)
Subjective:    Patient ID: Ashley Thornton, female    DOB: 1975/08/12, 40 y.o.   MRN: [id number]  Ashley Thornton is a 42 y.o. female presenting on 11/11/2016 for Rectal Bleeding (pt reports it started w/ diarrhea about 4 days ago mixed w/ bloody. Pt admits having internal hemorrhoids) and Constipation (iregular bowel movement, hard and small stool x 2 days )   HPI History of constipation: Occasionally wakes 3x per year w/ upset stomach, sweats, hot/cold chills, vomiting, diarrhea lasting 24 hours. - Tested for IBS- but no diagnosis.  Possible gastroenteritis.  Neck Pain 8/6 to ER - gabapentin not helping.  Cervical bone spurs - Orthopedic referral Prednisone completed on Sunday.  Improving neck pain, but still taking flexeril.   GI Bleed Took gabapentin and had upset stomach and diarrhea - Monday.  Vomited Monday - food, clear Tuesday - dehyrated w/ headache stomach nauseous, loose diarrhea.  Bloody stool.  Dripping blood - bright red blood no clots and continued stomach upset. BM yesterday - very small BM formed, mucous, bright red blood Today - urge, small BM w/o blood. Feels oncoming constipation. Pt has predominantly had abdominal pain, cramping, bloating, rumbling stomach and nausea.  Since Monday w/ some improvement yesterday Sharp pains LUQ,  No pain now. - During meals and immediately after worse rumbling and nausea.  Still has gall bladder. - Known to be lactose intolerant: pt avoiding all dairy even w/ lactase during these symptoms.    Social History  Substance Use Topics  . Smoking status: Current Every Day Smoker    Types: Cigarettes  . Smokeless tobacco: Never Used     Comment: Smokes 3 black and milds every day  . Alcohol use Yes     Comment: 3x monthly, less than 4 drinks per occurrence    Review of Systems Per HPI unless specifically indicated above     Objective:    BP 123/80 (BP Location: Right Arm, Patient Position: Sitting, Cuff Size: Normal)   Pulse  83   Temp 98.7 F (37.1 C) (Oral)   Ht 5\' 11"  (1.803 m)   Wt 191 lb 9.6 oz (86.9 kg)   BMI 26.72 kg/m   Wt Readings from Last 3 Encounters:  11/11/16 191 lb 9.6 oz (86.9 kg)  07/15/16 192 lb (87.1 kg)  08/03/15 175 lb (79.4 kg)    Physical Exam  Constitutional: She is oriented to person, place, and time. She appears well-developed and well-nourished. No distress.  HENT:  Head: Normocephalic and atraumatic.  Neck: Neck supple.  Cardiovascular: Normal rate, regular rhythm, normal heart sounds and intact distal pulses.   Pulmonary/Chest: Effort normal and breath sounds normal. No respiratory distress.  Abdominal: Soft. She exhibits distension. She exhibits no mass. There is no hepatosplenomegaly. There is generalized tenderness. There is no rigidity, no rebound, no guarding, no CVA tenderness, no tenderness at McBurney's point and negative Murphy's sign. No hernia.  Musculoskeletal:  Cervical paraspinal muscles w/ hypertonicity.  Decreased cervical AROM in all directions.  Neurological: She is alert and oriented to person, place, and time.  Skin: Skin is warm and dry.  Psychiatric: She has a normal mood and affect. Her behavior is normal. Judgment and thought content normal.  Vitals reviewed.    Results for orders placed or performed during the hospital encounter of 08/03/15  Urinalysis complete, with microscopic [facility] only)  Result Value Ref Range   Color, Urine YELLOW (A) YELLOW   APPearance CLEAR (A) CLEAR   Glucose,  UA NEGATIVE NEGATIVE mg/dL   Bilirubin Urine NEGATIVE NEGATIVE   Ketones, ur NEGATIVE NEGATIVE mg/dL   Specific Gravity, Urine 1.020 1.005 - 1.030   Hgb urine dipstick NEGATIVE NEGATIVE   pH 6.0 5.0 - 8.0   Protein, ur NEGATIVE NEGATIVE mg/dL   Nitrite NEGATIVE NEGATIVE   Leukocytes, UA NEGATIVE NEGATIVE   RBC / HPF 0-5 0 - 5 RBC/hpf   WBC, UA 0-5 0 - 5 WBC/hpf   Bacteria, UA NONE SEEN NONE SEEN   Squamous Epithelial / LPF 6-30 (A) NONE SEEN   Mucus PRESENT    Pregnancy, urine POC  Result Value Ref Range   Preg Test, Ur NEGATIVE NEGATIVE      Assessment & Plan:   Problem List Items Addressed This Visit    None    Visit Diagnoses    Gastrointestinal hemorrhage, unspecified gastrointestinal hemorrhage type    -  Primary Likely transient GI bleed w/ resolved BRBPR, but ongoing GI symptoms suggest ongoing pathology. POCT hemoccult today is negative. Pt notes no BM in 2 days and concern for constipation.  Plan: 1. CBC screen anemia w/ GI bleed. 2. Referral GI for further evaluation. 3. Reviewed warning signs.  Pt verbalizes understanding of when to call clinic or go to ED. 4. For constipation: eat high fiber diet. Use metamucil or miralax as needed w/ senokot or dulcolax if no BM in 4 days. 5. Follow up as needed.   Relevant Orders   Ambulatory referral to Gastroenterology   CBC with Differential        Follow up plan: Return 7-10 days if symptoms worsen or fail to improve.  [name], DNP, AGPCNP-BC Adult Gerontology Primary Care Nurse Practitioner [facility] 11/14/2016, 6:20 PM

## 2016-11-14 ENCOUNTER — Other Ambulatory Visit: Payer: 59

## 2016-11-14 ENCOUNTER — Encounter: Payer: Self-pay | Admitting: Gastroenterology

## 2016-11-14 DIAGNOSIS — K922 Gastrointestinal hemorrhage, unspecified: Secondary | ICD-10-CM

## 2016-11-14 LAB — CBC WITH DIFFERENTIAL/PLATELET
Basophils Absolute: 64 cells/uL (ref 0–200)
Basophils Relative: 1 %
Eosinophils Absolute: 192 cells/uL (ref 15–500)
Eosinophils Relative: 3 %
HCT: 42.3 % (ref 35.0–45.0)
Hemoglobin: 13.6 g/dL (ref 11.7–15.5)
Lymphocytes Relative: 51 %
Lymphs Abs: 3264 cells/uL (ref 850–3900)
MCH: 28.5 pg (ref 27.0–33.0)
MCHC: 32.2 g/dL (ref 32.0–36.0)
MCV: 88.5 fL (ref 80.0–100.0)
MPV: 12.2 fL (ref 7.5–12.5)
Monocytes Absolute: 448 cells/uL (ref 200–950)
Monocytes Relative: 7 %
Neutro Abs: 2432 cells/uL (ref 1500–7800)
Neutrophils Relative %: 38 %
Platelets: 208 10*3/uL (ref 140–400)
RBC: 4.78 MIL/uL (ref 3.80–5.10)
RDW: 13.3 % (ref 11.0–15.0)
WBC: 6.4 10*3/uL (ref 3.8–10.8)

## 2016-11-15 NOTE — Progress Notes (Signed)
I have reviewed this encounter including the documentation in this note and/or discussed this patient with the provider, [name], AGPCNP-BC. I am certifying that I agree with the content of this note as supervising physician.  Saralyn Pilar, DO [facility] 11/15/2016, 8:50 AM

## 2016-11-29 DIAGNOSIS — M542 Cervicalgia: Secondary | ICD-10-CM | POA: Diagnosis not present

## 2017-01-18 ENCOUNTER — Ambulatory Visit: Payer: 59 | Admitting: Gastroenterology

## 2017-01-18 ENCOUNTER — Encounter: Payer: Self-pay | Admitting: Gastroenterology

## 2017-03-17 ENCOUNTER — Telehealth: Payer: Self-pay | Admitting: Nurse Practitioner

## 2017-03-17 NOTE — Telephone Encounter (Signed)
Per Tedd SiasKelita, CMA: She states the muscle relaxer doesn't work for her. The prednisone worked last time and she doesn't care to hear the other recommendations, because she know what works for her.   No additional recommendations are to be made at this time.

## 2017-03-17 NOTE — Telephone Encounter (Signed)
Pt. Called states that she was having a flare up again wanted to know if you would refill her prednisone  [facility] [address]  Pt call back #  [phone]. Pt. Also states that if meds was called in  L/M  Was  Was off  Break.

## 2017-03-17 NOTE — Telephone Encounter (Signed)
Routine use of prednisone is not recommended.  Pt is more than willing to come for visit if she feels prednisone is necessary.  Can send refill of muscle relaxer, please let me know if a refill is needed.    - Should use regular dosing of naproxen sodium two tablets twice daily for next 2 weeks. - Can also take acetaminophen 1,000 mg three times daily as needed for pain. - Apply heat and/or ice to affected area. - May also apply a muscle rub with lidocaine after heat or ice. - Take muscle relaxer cyclobenzaprine 10 mg up to three times daily.  Caution drowsiness. - recommend physical therapy since muscle spasm/flares are occurring frequently.  Will place order if pt desires.   - Followup 2 weeks if symptoms are not improving.

## 2017-03-30 ENCOUNTER — Ambulatory Visit: Payer: 59 | Admitting: Nurse Practitioner

## 2017-05-23 ENCOUNTER — Other Ambulatory Visit: Payer: Self-pay | Admitting: Orthopedic Surgery

## 2017-05-23 DIAGNOSIS — M5030 Other cervical disc degeneration, unspecified cervical region: Secondary | ICD-10-CM

## 2017-05-23 DIAGNOSIS — M5412 Radiculopathy, cervical region: Secondary | ICD-10-CM | POA: Insufficient documentation

## 2017-05-30 ENCOUNTER — Ambulatory Visit
Admission: RE | Admit: 2017-05-30 | Discharge: 2017-05-30 | Disposition: A | Discharge status: Home / Self Care | Payer: 59 | Source: Ambulatory Visit | Attending: Orthopedic Surgery | Admitting: Orthopedic Surgery

## 2017-05-30 DIAGNOSIS — M4802 Spinal stenosis, cervical region: Secondary | ICD-10-CM | POA: Diagnosis not present

## 2017-05-30 DIAGNOSIS — M5030 Other cervical disc degeneration, unspecified cervical region: Secondary | ICD-10-CM

## 2017-05-30 DIAGNOSIS — M4302 Spondylolysis, cervical region: Secondary | ICD-10-CM | POA: Insufficient documentation

## 2017-05-30 DIAGNOSIS — M47812 Spondylosis without myelopathy or radiculopathy, cervical region: Secondary | ICD-10-CM | POA: Diagnosis not present

## 2017-05-30 DIAGNOSIS — M542 Cervicalgia: Secondary | ICD-10-CM | POA: Diagnosis not present

## 2017-05-31 ENCOUNTER — Ambulatory Visit: Payer: 59

## 2017-06-05 DIAGNOSIS — M5412 Radiculopathy, cervical region: Secondary | ICD-10-CM | POA: Diagnosis not present

## 2017-06-05 DIAGNOSIS — M5030 Other cervical disc degeneration, unspecified cervical region: Secondary | ICD-10-CM | POA: Diagnosis not present

## 2017-06-22 DIAGNOSIS — M5033 Other cervical disc degeneration, cervicothoracic region: Secondary | ICD-10-CM | POA: Diagnosis not present

## 2017-06-22 DIAGNOSIS — M5412 Radiculopathy, cervical region: Secondary | ICD-10-CM | POA: Diagnosis not present

## 2017-07-04 DIAGNOSIS — M542 Cervicalgia: Secondary | ICD-10-CM | POA: Diagnosis not present

## 2017-07-07 DIAGNOSIS — M5030 Other cervical disc degeneration, unspecified cervical region: Secondary | ICD-10-CM | POA: Diagnosis not present

## 2017-07-10 DIAGNOSIS — M542 Cervicalgia: Secondary | ICD-10-CM | POA: Diagnosis not present

## 2017-07-18 DIAGNOSIS — M542 Cervicalgia: Secondary | ICD-10-CM | POA: Diagnosis not present

## 2017-07-25 DIAGNOSIS — M542 Cervicalgia: Secondary | ICD-10-CM | POA: Diagnosis not present

## 2017-08-09 DIAGNOSIS — M542 Cervicalgia: Secondary | ICD-10-CM | POA: Diagnosis not present

## 2017-08-14 DIAGNOSIS — M5030 Other cervical disc degeneration, unspecified cervical region: Secondary | ICD-10-CM | POA: Diagnosis not present

## 2017-08-28 ENCOUNTER — Ambulatory Visit (INDEPENDENT_AMBULATORY_CARE_PROVIDER_SITE_OTHER): Payer: 59 | Admitting: Nurse Practitioner

## 2017-08-28 ENCOUNTER — Other Ambulatory Visit: Payer: Self-pay

## 2017-08-28 ENCOUNTER — Encounter: Payer: Self-pay | Admitting: Nurse Practitioner

## 2017-08-28 VITALS — BP 115/77 | HR 79 | Temp 99.2°F | Ht 71.0 in | Wt 186.4 lb

## 2017-08-28 DIAGNOSIS — Z0000 Encounter for general adult medical examination without abnormal findings: Secondary | ICD-10-CM | POA: Diagnosis not present

## 2017-08-28 DIAGNOSIS — Z0001 Encounter for general adult medical examination with abnormal findings: Secondary | ICD-10-CM | POA: Diagnosis not present

## 2017-08-28 DIAGNOSIS — Z124 Encounter for screening for malignant neoplasm of cervix: Secondary | ICD-10-CM

## 2017-08-28 DIAGNOSIS — Z1231 Encounter for screening mammogram for malignant neoplasm of breast: Secondary | ICD-10-CM | POA: Diagnosis not present

## 2017-08-28 DIAGNOSIS — Z23 Encounter for immunization: Secondary | ICD-10-CM | POA: Diagnosis not present

## 2017-08-28 DIAGNOSIS — N926 Irregular menstruation, unspecified: Secondary | ICD-10-CM

## 2017-08-28 DIAGNOSIS — N941 Unspecified dyspareunia: Secondary | ICD-10-CM

## 2017-08-28 DIAGNOSIS — N946 Dysmenorrhea, unspecified: Secondary | ICD-10-CM | POA: Diagnosis not present

## 2017-08-28 DIAGNOSIS — Z1239 Encounter for other screening for malignant neoplasm of breast: Secondary | ICD-10-CM

## 2017-08-28 NOTE — Progress Notes (Signed)
Subjective:    Patient ID: Ashley Thornton, female    DOB: 1975/11/08, 42 y.o.   MRN: [id number]  Ashley Thornton is a 42 y.o. female presenting on 08/28/2017 for Annual Exam (need medical clearance for neck & shoulder surgery for [facility]  ) and Vaginal Pain (during intercourse.pt concern about irregular menstrual cycles, heavy bleeding w/ blood clots , frequent cycles and severe cramping.)   HPI  Annual Physical Exam Patient has been feeling well other than left shoulder pain.  They have no acute concerns today. Sleeps 4-5 hours per night uninterrupted.  Patient will also need presurgical clearance for upcoming shoulder surgery with Dr. Shon Baton ([facility]).    HEALTH MAINTENANCE: Weight/BMI: stable Physical activity: some activity outside of work - yardwork Diet: generally healthy Seatbelt: never Sunscreen: not usually PAP: due Mammogram: Pt requests to start now at age 41 HIV: desires repeat Optometry: no Dentistry: no  VACCINES: Tetanus: due Influenza: regular with job  Irregular, painful menses Over last 3 months, patient reports irregular and painful periods.  She is having menses approximately every 2 to 3 weeks, which are associated with heavy bleeding, severe cramping, and clots.  She is also having pelvic pain with intercourse in left side of pelvis.  Pt has history of fallopian tube removal after ectopic pregancy in past.  Pt reports this was R side fallopian tube removal, but scar from surgery is present on her left side.  Past Medical History:  Diagnosis Date  . Anxiety   . Depression   . Headache   . Heart palpitations   . History of ectopic pregnancy 2006   single fallopian tube remains  . Ovarian cyst    right  . Pelvic pain in female   . Ruptured ear drum, left 2004   hearing returned  . Tinnitus aurium, bilateral    Past Surgical History:  Procedure Laterality Date  . BACK SURGERY  2005   L4-L5 slipped disc repair  . KNEE SURGERY Right 2010   Broken knee, leg, ankle - rods and screws remain   Social History   Socioeconomic History  . Marital status: Single    Spouse name: Not on file  . Number of children: Not on file  . Years of education: Not on file  . Highest education level: Not on file  Occupational History  . Not on file  Social Needs  . Financial resource strain: Not on file  . Food insecurity:    Worry: Not on file    Inability: Not on file  . Transportation needs:    Medical: Not on file    Non-medical: Not on file  Tobacco Use  . Smoking status: Current Every Day Smoker    Types: Cigarettes  . Smokeless tobacco: Never Used  . Tobacco comment: Smokes 3 black and milds every day  Substance and Sexual Activity  . Alcohol use: Yes    Comment: 3x monthly, less than 4 drinks per occurrence  . Drug use: Yes    Frequency: 3.0 times per week    Types: Marijuana  . Sexual activity: Yes    Birth control/protection: Condom  Lifestyle  . Physical activity:    Days per week: Not on file    Minutes per session: Not on file  . Stress: Not on file  Relationships  . Social connections:    Talks on phone: Not on file    Gets together: Not on file    Attends religious service: Not  on file    Active member of club or organization: Not on file    Attends meetings of clubs or organizations: Not on file    Relationship status: Not on file  . Intimate partner violence:    Fear of current or ex partner: Not on file    Emotionally abused: Not on file    Physically abused: Not on file    Forced sexual activity: Not on file  Other Topics Concern  . Not on file  Social History Narrative  . Not on file   No family history on file. Current Outpatient Medications on File Prior to Visit  Medication Sig  . acetaminophen (TYLENOL) 500 MG tablet Take 1,000 mg by mouth daily as needed.   No current facility-administered medications on file prior to visit.     Review of Systems  Constitutional: Negative for chills and  fever.  HENT: Negative for congestion and sore throat.   Eyes: Negative for pain.  Respiratory: Negative for cough, shortness of breath and wheezing.   Cardiovascular: Negative for chest pain, palpitations and leg swelling.  Gastrointestinal: Negative for abdominal pain, blood in stool, constipation, diarrhea, nausea and vomiting.  Endocrine: Negative for polydipsia.  Genitourinary: Positive for dyspareunia, menstrual problem and pelvic pain. Negative for dysuria, frequency, hematuria, urgency, vaginal discharge and vaginal pain.  Musculoskeletal: Negative for back pain, myalgias and neck pain.  Skin: Negative.  Negative for rash.  Allergic/Immunologic: Negative for environmental allergies.  Neurological: Negative for dizziness, weakness and headaches.  Hematological: Does not bruise/bleed easily.  Psychiatric/Behavioral: Negative for dysphoric mood, sleep disturbance and suicidal ideas. The patient is not nervous/anxious.    Per HPI unless specifically indicated above     Objective:    BP 115/77 (BP Location: Right Arm, Patient Position: Sitting, Cuff Size: Normal)   Pulse 79   Temp 99.2 F (37.3 C) (Oral)   Ht 5\' 11"  (1.803 m)   Wt 186 lb 6.4 oz (84.6 kg)   LMP 08/25/2017   BMI 26.00 kg/m   Wt Readings from Last 3 Encounters:  08/28/17 186 lb 6.4 oz (84.6 kg)  11/11/16 191 lb 9.6 oz (86.9 kg)  07/15/16 192 lb (87.1 kg)    Physical Exam  Constitutional: She is oriented to person, place, and time. She appears well-developed and well-nourished. No distress.  HENT:  Head: Normocephalic and atraumatic.  Right Ear: External ear normal.  Left Ear: External ear normal.  Nose: Nose normal.  Mouth/Throat: Oropharynx is clear and moist.  Eyes: Pupils are equal, round, and reactive to light. Conjunctivae are normal.  Neck: Normal range of motion. Neck supple. No JVD present. No tracheal deviation present. No thyromegaly present.  Cardiovascular: Normal rate, regular rhythm, normal  heart sounds and intact distal pulses. Exam reveals no gallop and no friction rub.  No murmur heard. Pulmonary/Chest: Effort normal and breath sounds normal. No respiratory distress.  Breast - Normal exam w/ symmetric breasts, no mass, no nipple discharge, no skin changes or tenderness.  Abdominal: Soft. Bowel sounds are normal. She exhibits no distension. There is no tenderness.  Genitourinary:  Genitourinary Comments: Normal external female genitalia without lesions or fusion. Vaginal canal without lesions. Normal appearing cervix without lesions or friability. Physiologic discharge on exam. Bimanual exam without cervical motion tenderness, reveals left adnexal mass that is mobile with cervical motion.  Musculoskeletal: Normal range of motion.  Lymphadenopathy:    She has no cervical adenopathy.  Neurological: She is alert and oriented to person, place, and  time. No cranial nerve deficit.  Skin: Skin is warm and dry. Capillary refill takes less than 2 seconds.  Psychiatric: She has a normal mood and affect. Her behavior is normal. Judgment and thought content normal.  Nursing note and vitals reviewed.    Results for orders placed or performed in visit on 08/28/17  [facility]  Result Value Ref Range   QUESTION/PROBLEM:     QUESTION: UC/KUC or UM/KUM not appropriate for test ordered.       Assessment & Plan:   Problem List Items Addressed This Visit    None    Visit Diagnoses    Encounter for general adult medical examination with abnormal findings    -  Primary   Relevant Orders   Urinalysis   CBC with Differential/Platelet   COMPLETE METABOLIC PANEL WITH GFR   Lipid panel   TSH   HIV antibody   MM DIGITAL SCREENING BILATERAL   Need for diphtheria-tetanus-pertussis (Tdap) vaccine       Relevant Orders   Tdap vaccine greater than or equal to 7yo IM (Completed)   Breast cancer screening       Relevant Orders   MM DIGITAL SCREENING BILATERAL   Irregular menses       Relevant  Orders   US PELVIC COMPLETE WITH TRANSVAGINAL   Dyspareunia in female       Relevant Orders   US PELVIC COMPLETE WITH TRANSVAGINAL   Dysmenorrhea       Relevant Orders   US PELVIC COMPLETE WITH TRANSVAGINAL   Cervical cancer screening       Relevant Orders   Pap IG and HPV (high risk) DNA detection    #1 Physical exam with new findings of possible uterine fibroid.  Well adult with acute concerns for dyspareunia and dysmenorrhea.  Plan: 1. Obtain health maintenance screenings (labs, mammo, PAP) as above according to age. - Increase physical activity to 30 minutes most days of the week.  - Eat healthy diet high in vegetables and fruits; low in refined carbohydrates. - Administer Tdap today. 2. Return 1 year for annual physical.  #2 Dysmenorrhea, Dyspareunia: New, acute to subacute dyspareunia.  Dysmenorrhea present x last several months per pt report.  Consideration of endometriosis vs uterine fibroid.  Pt is smoker and currently declines hormonal contraception.  Currently using condoms.   Plan: 1. Transvaginal ultrasound 2. Consider hormonal contraception for treatment of symptoms in future. 3. Consider GYN referral for further workup / management. 4. Followup after ultrasound or if symptoms worsen.    Follow up plan: Return in about 1 year (around 08/29/2018) for annual physical AND in 3 months for irregular menses.  Wilhelmina McardleLauren Yasmene Salomone, DNP, AGPCNP-BC Adult Gerontology Primary Care Nurse Practitioner [facility] [place] [facility] 08/29/2017, 1:27 PM

## 2017-08-28 NOTE — Patient Instructions (Addendum)
Ashley Thornton,   Thank you for coming in to clinic today.  1. [facility] Address: [address], [place], [place] [address]  Phone: [phone]  Leander RamsKaren Barwick, DDS Address: [address], [place], [place] [address]  Phone: [phone]  2. Your mammogram order has been placed.  Call the Scheduling phone number at [phone] to schedule your mammogram at your convenience.  You can choose to go to either location listed below.  Let the scheduler know which location you prefer.  [facility]  [facility]  [address]  [place], [place] [address]   [facility] [place] [facility] [address] [place], [place] [address]  3. You will be due for FASTING BLOOD WORK.  This means you should eat no food or drink after midnight.  Drink only water or coffee without cream/sugar on the morning of your lab visit. - Please go ahead and schedule a "Lab Only" visit in the morning at the clinic for lab draw in the next 7 days. - Your results will be available about 2-3 days after blood draw.  If you have set up a MyChart account, you can can log in to MyChart online to view your results and a brief explanation. Also, we can discuss your results together at your next office visit if you would like.  - Increase physical activity to 30 minutes most days of the week. - Work to eat healthy diet high in vegetables, fruits, and lean proteins.  Please schedule a follow-up appointment with Wilhelmina McardleLauren Danelia Snodgrass, AGNP. Return in about 1 year (around 08/29/2018) for annual physical AND in 3 months for irregular menses.  If you have any other questions or concerns, please feel free to call the clinic or send a message through MyChart. You may also schedule an earlier appointment if necessary.  You will receive a survey after today's visit either digitally by e-mail or paper by [employer] mail. Your experiences and feedback matter to us.  Please  respond so we know how we are doing as we provide care for you.   Wilhelmina McardleLauren Cadence Haslam, DNP, AGNP-BC Adult Gerontology Nurse Practitioner [facility] [place] [facility], [facility]

## 2017-08-29 ENCOUNTER — Telehealth: Payer: Self-pay

## 2017-08-29 LAB — PAP IG AND HPV HIGH-RISK: HPV DNA High Risk: NOT DETECTED

## 2017-08-29 NOTE — Telephone Encounter (Signed)
The pt was notified. No questions or concerns. 

## 2017-08-29 NOTE — Telephone Encounter (Signed)
-----   Message from Galen ManilaLauren Renee Kennedy, NP sent at 08/29/2017  8:33 AM EDT ----- Urinalysis specimen sent in cup, not tube.  Cannot be run - will need to be recollected.  Please let pt know we will need recollect when she comes for blood draw.

## 2017-08-30 ENCOUNTER — Other Ambulatory Visit: Payer: Self-pay | Admitting: Nurse Practitioner

## 2017-08-30 DIAGNOSIS — Z0001 Encounter for general adult medical examination with abnormal findings: Secondary | ICD-10-CM

## 2017-08-30 LAB — CBC WITH DIFFERENTIAL/PLATELET

## 2017-08-30 LAB — HIV ANTIBODY (ROUTINE TESTING W REFLEX)

## 2017-08-30 LAB — TIQ-MISC

## 2017-08-30 LAB — LIPID PANEL

## 2017-08-30 LAB — TSH

## 2017-08-30 LAB — COMPLETE METABOLIC PANEL WITH GFR

## 2017-09-01 ENCOUNTER — Other Ambulatory Visit: Payer: 59

## 2017-09-01 DIAGNOSIS — Z0001 Encounter for general adult medical examination with abnormal findings: Secondary | ICD-10-CM

## 2017-09-01 DIAGNOSIS — M542 Cervicalgia: Secondary | ICD-10-CM | POA: Diagnosis not present

## 2017-09-01 LAB — URINALYSIS, COMPLETE
Bacteria, UA: NONE SEEN /HPF
Bilirubin Urine: NEGATIVE
Glucose, UA: NEGATIVE
Hgb urine dipstick: NEGATIVE
Hyaline Cast: NONE SEEN /LPF
Ketones, ur: NEGATIVE
Leukocytes, UA: NEGATIVE
Nitrite: NEGATIVE
Protein, ur: NEGATIVE
RBC / HPF: NONE SEEN /HPF (ref 0–2)
Specific Gravity, Urine: 1.025 (ref 1.001–1.03)
Squamous Epithelial / LPF: NONE SEEN /HPF (ref <=5)
WBC, UA: NONE SEEN /HPF (ref 0–5)
pH: 7.5 (ref 5.0–8.0)

## 2017-09-02 LAB — CBC WITH DIFFERENTIAL/PLATELET
Basophils Absolute: 29 cells/uL (ref 0–200)
Basophils Relative: 0.5 %
Eosinophils Absolute: 81 cells/uL (ref 15–500)
Eosinophils Relative: 1.4 %
HCT: 37.5 % (ref 35.0–45.0)
Hemoglobin: 12.6 g/dL (ref 11.7–15.5)
Lymphs Abs: 2569 cells/uL (ref 850–3900)
MCH: 28.8 pg (ref 27.0–33.0)
MCHC: 33.6 g/dL (ref 32.0–36.0)
MCV: 85.8 fL (ref 80.0–100.0)
MPV: 12.9 fL — ABNORMAL HIGH (ref 7.5–12.5)
Monocytes Relative: 7.5 %
Neutro Abs: 2685 cells/uL (ref 1500–7800)
Neutrophils Relative %: 46.3 %
Platelets: 184 10*3/uL (ref 140–400)
RBC: 4.37 10*6/uL (ref 3.80–5.10)
RDW: 11.7 % (ref 11.0–15.0)
Total Lymphocyte: 44.3 %
WBC mixed population: 435 cells/uL (ref 200–950)
WBC: 5.8 10*3/uL (ref 3.8–10.8)

## 2017-09-02 LAB — COMPLETE METABOLIC PANEL WITH GFR
AG Ratio: 1.5 (calc) (ref 1.0–2.5)
ALT: 13 U/L (ref 6–29)
AST: 19 U/L (ref 10–30)
Albumin: 4.3 g/dL (ref 3.6–5.1)
Alkaline phosphatase (APISO): 66 U/L (ref 33–115)
BUN: 12 mg/dL (ref 7–25)
CO2: 26 mmol/L (ref 20–32)
Calcium: 9.6 mg/dL (ref 8.6–10.2)
Chloride: 104 mmol/L (ref 98–110)
Creat: 0.87 mg/dL (ref 0.50–1.10)
GFR, Est African American: 97 mL/min/{1.73_m2} (ref >=60)
GFR, Est Non African American: 83 mL/min/{1.73_m2} (ref >=60)
Globulin: 2.9 g/dL (calc) (ref 1.9–3.7)
Glucose, Bld: 91 mg/dL (ref 65–99)
Potassium: 4.6 mmol/L (ref 3.5–5.3)
Sodium: 139 mmol/L (ref 135–146)
Total Bilirubin: 0.6 mg/dL (ref 0.2–1.2)
Total Protein: 7.2 g/dL (ref 6.1–8.1)

## 2017-09-02 LAB — LIPID PANEL
Cholesterol: 193 mg/dL (ref <200)
HDL: 66 mg/dL (ref >50)
LDL Cholesterol (Calc): 110 mg/dL (calc) — ABNORMAL HIGH
Non-HDL Cholesterol (Calc): 127 mg/dL (calc) (ref <130)
Total CHOL/HDL Ratio: 2.9 (calc) (ref <5.0)
Triglycerides: 80 mg/dL (ref <150)

## 2017-09-02 LAB — TSH: TSH: 0.87 mIU/L

## 2017-09-02 LAB — HIV ANTIBODY (ROUTINE TESTING W REFLEX): HIV 1&2 Ab, 4th Generation: NONREACTIVE

## 2017-09-04 ENCOUNTER — Other Ambulatory Visit: Payer: Self-pay | Admitting: Nurse Practitioner

## 2017-09-04 NOTE — Progress Notes (Signed)
Pre-operative clearance form has been completed.  Pt is at average risk for surgery planned (ACDF C3-4) without pre-existing or uncontrolled medical complications.  Pt may proceed with planned procedure.

## 2017-09-06 ENCOUNTER — Telehealth: Payer: Self-pay

## 2017-09-06 NOTE — Telephone Encounter (Signed)
US appt appt scheduled for June 18th at 11:15am. The pt was notified of appt time and instructions.

## 2017-09-06 NOTE — Telephone Encounter (Signed)
Attempted to contact the pt to f/u with her to see what date/time she prefer to have her Transvaginal  US. LMOM to return my call.

## 2017-09-12 ENCOUNTER — Ambulatory Visit
Admission: RE | Admit: 2017-09-12 | Discharge: 2017-09-12 | Disposition: A | Discharge status: Home / Self Care | Payer: 59 | Source: Ambulatory Visit | Attending: Nurse Practitioner | Admitting: Nurse Practitioner

## 2017-09-12 DIAGNOSIS — N946 Dysmenorrhea, unspecified: Secondary | ICD-10-CM

## 2017-09-12 DIAGNOSIS — N941 Unspecified dyspareunia: Secondary | ICD-10-CM | POA: Diagnosis present

## 2017-09-12 DIAGNOSIS — N926 Irregular menstruation, unspecified: Secondary | ICD-10-CM

## 2017-10-10 DIAGNOSIS — M5030 Other cervical disc degeneration, unspecified cervical region: Secondary | ICD-10-CM | POA: Diagnosis not present

## 2017-10-13 DIAGNOSIS — M5011 Cervical disc disorder with radiculopathy,  high cervical region: Secondary | ICD-10-CM | POA: Diagnosis not present

## 2017-10-13 DIAGNOSIS — M2578 Osteophyte, vertebrae: Secondary | ICD-10-CM | POA: Diagnosis not present

## 2017-10-13 DIAGNOSIS — M5012 Mid-cervical disc disorder, unspecified level: Secondary | ICD-10-CM | POA: Diagnosis not present

## 2017-10-13 DIAGNOSIS — M5022 Other cervical disc displacement, mid-cervical region, unspecified level: Secondary | ICD-10-CM | POA: Diagnosis not present

## 2017-11-17 DIAGNOSIS — M5012 Mid-cervical disc disorder, unspecified level: Secondary | ICD-10-CM | POA: Diagnosis not present

## 2017-11-17 DIAGNOSIS — M5022 Other cervical disc displacement, mid-cervical region, unspecified level: Secondary | ICD-10-CM | POA: Diagnosis not present

## 2017-11-28 DIAGNOSIS — Z4889 Encounter for other specified surgical aftercare: Secondary | ICD-10-CM | POA: Diagnosis not present

## 2017-11-28 DIAGNOSIS — M5412 Radiculopathy, cervical region: Secondary | ICD-10-CM | POA: Diagnosis not present

## 2017-11-30 DIAGNOSIS — Z4889 Encounter for other specified surgical aftercare: Secondary | ICD-10-CM | POA: Diagnosis not present

## 2017-11-30 DIAGNOSIS — M5412 Radiculopathy, cervical region: Secondary | ICD-10-CM | POA: Diagnosis not present

## 2017-12-04 DIAGNOSIS — M5412 Radiculopathy, cervical region: Secondary | ICD-10-CM | POA: Diagnosis not present

## 2017-12-04 DIAGNOSIS — Z4889 Encounter for other specified surgical aftercare: Secondary | ICD-10-CM | POA: Diagnosis not present

## 2017-12-07 DIAGNOSIS — Z4889 Encounter for other specified surgical aftercare: Secondary | ICD-10-CM | POA: Diagnosis not present

## 2017-12-07 DIAGNOSIS — M5412 Radiculopathy, cervical region: Secondary | ICD-10-CM | POA: Diagnosis not present

## 2017-12-13 DIAGNOSIS — Z4889 Encounter for other specified surgical aftercare: Secondary | ICD-10-CM | POA: Diagnosis not present

## 2017-12-13 DIAGNOSIS — M5412 Radiculopathy, cervical region: Secondary | ICD-10-CM | POA: Diagnosis not present

## 2017-12-14 DIAGNOSIS — M5412 Radiculopathy, cervical region: Secondary | ICD-10-CM | POA: Diagnosis not present

## 2017-12-14 DIAGNOSIS — Z4889 Encounter for other specified surgical aftercare: Secondary | ICD-10-CM | POA: Diagnosis not present

## 2017-12-18 DIAGNOSIS — Z4889 Encounter for other specified surgical aftercare: Secondary | ICD-10-CM | POA: Diagnosis not present

## 2017-12-18 DIAGNOSIS — M5412 Radiculopathy, cervical region: Secondary | ICD-10-CM | POA: Diagnosis not present

## 2017-12-21 DIAGNOSIS — M5412 Radiculopathy, cervical region: Secondary | ICD-10-CM | POA: Diagnosis not present

## 2017-12-21 DIAGNOSIS — Z4889 Encounter for other specified surgical aftercare: Secondary | ICD-10-CM | POA: Diagnosis not present

## 2017-12-25 DIAGNOSIS — Z4889 Encounter for other specified surgical aftercare: Secondary | ICD-10-CM | POA: Diagnosis not present

## 2017-12-25 DIAGNOSIS — M5412 Radiculopathy, cervical region: Secondary | ICD-10-CM | POA: Diagnosis not present

## 2018-01-03 ENCOUNTER — Encounter: Payer: Self-pay | Admitting: Nurse Practitioner

## 2018-01-03 ENCOUNTER — Ambulatory Visit (INDEPENDENT_AMBULATORY_CARE_PROVIDER_SITE_OTHER): Payer: 59 | Admitting: Nurse Practitioner

## 2018-01-03 ENCOUNTER — Other Ambulatory Visit: Payer: Self-pay

## 2018-01-03 ENCOUNTER — Other Ambulatory Visit (HOSPITAL_COMMUNITY)
Admission: RE | Admit: 2018-01-03 | Discharge: 2018-01-03 | Disposition: A | Discharge status: Home / Self Care | Payer: 59 | Source: Ambulatory Visit | Attending: Nurse Practitioner | Admitting: Nurse Practitioner

## 2018-01-03 VITALS — BP 125/73 | HR 68 | Temp 99.6°F | Ht 71.0 in | Wt 186.8 lb

## 2018-01-03 DIAGNOSIS — Z7689 Persons encountering health services in other specified circumstances: Secondary | ICD-10-CM | POA: Insufficient documentation

## 2018-01-03 DIAGNOSIS — A6004 Herpesviral vulvovaginitis: Secondary | ICD-10-CM

## 2018-01-03 MED ORDER — VALACYCLOVIR HCL 1 G PO TABS: 1000.0000 mg | ORAL_TABLET | Freq: Two times a day (BID) | ORAL | 2 refills | Status: AC

## 2018-01-03 NOTE — Patient Instructions (Addendum)
Ashley Thornton,   Thank you for coming in to clinic today.  1. These appear to be genital herpes lesions.  We are sending all STD tests to confirm.  - START valacyclovir 1,000 mg tablet twice daily for 7 days for your current outbreak.  May repeat for repeat outbreaks.  Call if > 1 per month or more than 3 in 3 months.  Please schedule a follow-up appointment with [name], AGNP. Return if symptoms worsen or fail to improve.  If you have any other questions or concerns, please feel free to call the clinic or send a message through MyChart. You may also schedule an earlier appointment if necessary.  You will receive a survey after today's visit either digitally by e-mail or paper by [employer]. Your experiences and feedback matter to [place].  Please respond so we know how we are doing as we provide care for you.   [name], DNP, AGNP-BC Adult Gerontology Nurse Practitioner [facility], [facility]   Genital Herpes Genital herpes is a common sexually transmitted infection (STI) that is caused by a virus. The virus spreads from person to person through sexual contact. Infection can cause itching, blisters, and sores around the genitals or rectum. Symptoms may last several days and then go away This is called an outbreak. However, the virus remains in your body, so you may have more outbreaks in the future. The time between outbreaks varies and can be months or years. Genital herpes affects men and women. It is particularly concerning for pregnant women because the virus can be passed to the baby during delivery and can cause serious problems. Genital herpes is also a concern for people who have a weak disease-fighting (immune) system. What are the causes? This condition is caused by the herpes simplex virus (HSV) type 1 or type 2. The virus may spread through:  Sexual contact with an infected person, including vaginal, anal, and oral sex.  Contact with fluid from a herpes  sore.  The skin. This means that you can get herpes from an infected partner even if he or she does not have a visible sore or does not know that he or she is infected.  What increases the risk? You are more likely to develop this condition if:  You have sex with many partners.  You do not use latex condoms during sex.  What are the signs or symptoms? Most people do not have symptoms (asymptomatic) or have mild symptoms that may be mistaken for other skin problems. Symptoms may include:  Small red bumps near the genitals, rectum, or mouth. These bumps turn into blisters and then turn into sores.  Flu-like symptoms, including: ? Fever. ? Body aches. ? Swollen lymph nodes. ? Headache.  Painful urination.  Pain and itching in the genital area or rectal area.  Vaginal discharge.  Tingling or shooting pain in the legs and buttocks.  Generally, symptoms are more severe and last longer during the first (primary) outbreak. Flu-like symptoms are also more common during the primary outbreak. How is this diagnosed? Genital herpes may be diagnosed based on:  A physical exam.  Your medical history.  Blood tests.  A test of a fluid sample (culture) from an open sore.  How is this treated? There is no cure for this condition, but treatment with antiviral medicines that are taken by mouth (orally) can do the following:  Speed up healing and relieve symptoms.  Help to reduce the spread of the  virus to sexual partners.  Limit the chance of future outbreaks, or make future outbreaks shorter.  Lessen symptoms of future outbreaks.  Your health care provider may also recommend pain relief medicines, such as aspirin or ibuprofen. Follow these instructions at home: Sexual activity  Do not have sexual contact during active outbreaks.  Practice safe sex. Latex condoms and female condoms may help prevent the spread of the herpes virus. General instructions  Keep the affected areas  dry and clean.  Take over-the-counter and prescription medicines only as told by your health care provider.  Avoid rubbing or touching blisters and sores. If you do touch blisters or sores: ? Wash your hands thoroughly with soap and water. ? Do not touch your eyes afterward.  To help relieve pain or itching, you may take the following actions as directed by your health care provider: ? Apply a cold, wet cloth (cold compress) to affected areas 4-6 times a day. ? Apply a substance that protects your skin and reduces bleeding (astringent). ? Apply a gel that helps relieve pain around sores (lidocaine gel). ? Take a warm, shallow bath that cleans the genital area (sitz bath).  Keep all follow-up visits as told by your health care provider. This is important. How is this prevented?  Use condoms. Although anyone can get genital herpes during sexual contact, even with the use of a condom, a condom can provide some protection.  Avoid having multiple sexual partners.  Talk with your sexual partner about any symptoms either of you may have. Also, talk with your partner about any history of STIs.  Get tested for STIs before you have sex. Ask your partner to do the same.  Do not have sexual contact if you have symptoms of genital herpes. Contact a health care provider if:  Your symptoms are not improving with medicine.  Your symptoms return.  You have new symptoms.  You have a fever.  You have abdominal pain.  You have redness, swelling, or pain in your eye.  You notice new sores on other parts of your body.  You are a woman and experience bleeding between menstrual periods.  You have had herpes and you become pregnant or plan to become pregnant. Summary  Genital herpes is a common sexually transmitted infection (STI) that is caused by the herpes simplex virus (HSV) type 1 or type 2.  These viruses are most often spread through sexual contact with an infected person.  You are  more likely to develop this condition if you have sex with many partners or you have unprotected sex.  Most people do not have symptoms (asymptomatic) or have mild symptoms that may be mistaken for other skin problems. Symptoms occur as outbreaks that may happen months or years apart.  There is no cure for this condition, but treatment with oral antiviral medicines can reduce symptoms, reduce the chance of spreading the virus to a partner, prevent future outbreaks, or shorten future outbreaks. This information is not intended to replace advice given to you by your health care provider. Make sure you discuss any questions you have with your health care provider. Document Released: 03/11/2000 Document Revised: 02/12/2016 Document Reviewed: 02/12/2016 Elsevier Interactive Patient Education  [employer].

## 2018-01-03 NOTE — Progress Notes (Signed)
Subjective:    Patient ID: Ashley Thornton, female    DOB: March 25, 1976, 42 y.o.   MRN: [id number]  Ashley Thornton is a 42 y.o. female presenting on 01/03/2018 for Unprotected Sex (possible exposed to STD, knot on the inside of the labia x 4 days )   HPI Genital lesions with nodular swelling on Left labia Same partner x 3 years, but has unprotected sex over the last year.  Patient is concerned for possible STD. Onset of symptoms occurred about 3 days ago.  She had LMP Oct 1st off 7th, then felt discomfort in vaginal area on 6th.  Is concerned she may have caused some trauma with tampons if not an STD. - Desires STD testing.  Initial sore was worse then.  Healing now.  Were open lesions (patient brings picture of initial lesion).  This was also accompanied by a swollen nodule under left labia skin which has also improved.  Social History   Tobacco Use  . Smoking status: Current Every Day Smoker    Types: Cigarettes  . Smokeless tobacco: Never Used  . Tobacco comment: Smokes 3 black and milds every day  Substance Use Topics  . Alcohol use: Yes    Comment: 3x monthly, less than 4 drinks per occurrence  . Drug use: Yes    Frequency: 3.0 times per week    Types: Marijuana    Review of Systems Per HPI unless specifically indicated above     Objective:    BP 125/73 (BP Location: Right Arm, Patient Position: Sitting, Cuff Size: Normal)   Pulse 68   Temp 99.6 F (37.6 C) (Oral)   Ht 5\' 11"  (1.803 m)   Wt 186 lb 12.8 oz (84.7 kg)   LMP 12/26/2017   BMI 26.05 kg/m   Wt Readings from Last 3 Encounters:  01/03/18 186 lb 12.8 oz (84.7 kg)  08/28/17 186 lb 6.4 oz (84.6 kg)  11/11/16 191 lb 9.6 oz (86.9 kg)    Physical Exam  Constitutional: She is oriented to person, place, and time. She appears well-developed and well-nourished. No distress.  HENT:  Head: Normocephalic and atraumatic.  Cardiovascular: Normal rate, regular rhythm, S1 normal, S2 normal, normal heart sounds and  intact distal pulses.  Pulmonary/Chest: Effort normal and breath sounds normal. No respiratory distress.  Genitourinary:     Genitourinary Comments: Normal external female genitalia with lesions as noted.  No fusion. Vaginal canal without lesions. Normal appearing cervix without lesions or friability. Physiologic discharge on exam. Bimanual exam without adnexal masses, enlarged uterus, or cervical motion tenderness.  Neurological: She is alert and oriented to person, place, and time.  Skin: Skin is warm and dry.  Psychiatric: She has a normal mood and affect. Her behavior is normal.  Vitals reviewed.    Results for orders placed or performed in visit on 01/03/18  Herpes simplex virus culture  Result Value Ref Range   MICRO NUMBER: [id number]    SPECIMEN QUALITY: ADEQUATE    Source OTHER (SPECIFY)    STATUS: FINAL    HSV CULTURE: Not isolated   RPR  Result Value Ref Range   RPR Ser Ql NON-REACTIVE NON-REACTI  HIV Antibody (routine testing w rflx)  Result Value Ref Range   HIV 1&2 Ab, 4th Generation NON-REACTIVE NON-REACTI  Hepatitis C antibody  Result Value Ref Range   Hepatitis C Ab NON-REACTIVE NON-REACTI   SIGNAL TO CUT-OFF 0.02 <1.00  HSV(herpes simplex vrs) 1+2 ab-IgG  Result Value Ref Range  HAV 1 IGG,TYPE SPECIFIC AB <0.90 index   HSV 2 IGG,TYPE SPECIFIC AB >23.00 (H) index  Cervicovaginal ancillary only  Result Value Ref Range   Chlamydia Negative    Neisseria gonorrhea Negative    Trichomonas Negative       Assessment & Plan:   Problem List Items Addressed This Visit    None    Visit Diagnoses    Encounter for assessment of STD exposure    -  Primary   Relevant Medications   valACYclovir (VALTREX) 1000 MG tablet   Other Relevant Orders   RPR (Completed)   HIV Antibody (routine testing w rflx) (Completed)   Hepatitis C antibody (Completed)   Herpes simplex virus culture (Completed)   HSV(herpes simplex vrs) 1+2 ab-IgM   HSV(herpes simplex vrs) 1+2 ab-IgG  (Completed)   Cervicovaginal ancillary only (Completed)   Herpes simplex vulvovaginitis       Relevant Medications   valACYclovir (VALTREX) 1000 MG tablet     Patient with new vulvar lesions most consistent with HSV2.  Patient desires to have all STD testing performed today.  Is in a mutually monogamous relationship x last 3 years.   Exam not consistent with any other STD. - STD testing performed - Culture of herpetic lesions obtained - START valtrex 1000 mg bid x 7 days  May repeat for future outbreaks. - Educated patient on latency period between contraction of HSV and first outbreak.  Reassured patient that can be controlled.  Spread higher during outbreaks.  May have come from any of her prior sexual partners. - Followup prn.  Meds ordered this encounter  Medications  . valACYclovir (VALTREX) 1000 MG tablet    Sig: Take 1 tablet (1,000 mg total) by mouth 2 (two) times daily for 7 days.    Dispense:  14 tablet    Refill:  2    Order Specific Question:   Supervising Provider    Answer:   Smitty Cords [[id number]]    Follow up plan: Return if symptoms worsen or fail to improve.  [name], DNP, AGPCNP-BC Adult Gerontology Primary Care Nurse Practitioner [facility] 01/06/2018, 11:44 AM

## 2018-01-04 LAB — CERVICOVAGINAL ANCILLARY ONLY
Chlamydia: NEGATIVE
Neisseria Gonorrhea: NEGATIVE
Trichomonas: NEGATIVE

## 2018-01-05 LAB — HSV(HERPES SIMPLEX VRS) I + II AB-IGG
HAV 1 IGG,TYPE SPECIFIC AB: <0.9 index
HSV 2 IGG,TYPE SPECIFIC AB: >23 index — ABNORMAL HIGH

## 2018-01-05 LAB — HERPES SIMPLEX VIRUS CULTURE
MICRO NUMBER:: 91216655
SPECIMEN QUALITY:: ADEQUATE

## 2018-01-05 LAB — RPR: RPR Ser Ql: NONREACTIVE

## 2018-01-05 LAB — HIV ANTIBODY (ROUTINE TESTING W REFLEX): HIV 1&2 Ab, 4th Generation: NONREACTIVE

## 2018-01-05 LAB — HEPATITIS C ANTIBODY
Hepatitis C Ab: NONREACTIVE
SIGNAL TO CUT-OFF: 0.02 (ref <1.00)

## 2018-01-06 ENCOUNTER — Encounter: Payer: Self-pay | Admitting: Nurse Practitioner

## 2018-01-15 DIAGNOSIS — M5030 Other cervical disc degeneration, unspecified cervical region: Secondary | ICD-10-CM | POA: Diagnosis not present

## 2018-01-17 DIAGNOSIS — M542 Cervicalgia: Secondary | ICD-10-CM | POA: Diagnosis not present

## 2018-01-18 ENCOUNTER — Other Ambulatory Visit: Payer: Self-pay

## 2018-01-18 ENCOUNTER — Emergency Department
Admission: EM | Admit: 2018-01-18 | Discharge: 2018-01-18 | Discharge status: Against Medical Advice | Payer: 59 | Attending: Emergency Medicine | Admitting: Emergency Medicine

## 2018-01-18 ENCOUNTER — Encounter: Payer: Self-pay | Admitting: Emergency Medicine

## 2018-01-18 ENCOUNTER — Emergency Department: Payer: 59

## 2018-01-18 ENCOUNTER — Telehealth: Payer: Self-pay | Admitting: Emergency Medicine

## 2018-01-18 DIAGNOSIS — R079 Chest pain, unspecified: Secondary | ICD-10-CM | POA: Insufficient documentation

## 2018-01-18 DIAGNOSIS — R110 Nausea: Secondary | ICD-10-CM | POA: Diagnosis not present

## 2018-01-18 DIAGNOSIS — Z5321 Procedure and treatment not carried out due to patient leaving prior to being seen by health care provider: Secondary | ICD-10-CM | POA: Diagnosis not present

## 2018-01-18 DIAGNOSIS — R0789 Other chest pain: Secondary | ICD-10-CM | POA: Diagnosis not present

## 2018-01-18 DIAGNOSIS — R0600 Dyspnea, unspecified: Secondary | ICD-10-CM | POA: Diagnosis not present

## 2018-01-18 DIAGNOSIS — M546 Pain in thoracic spine: Secondary | ICD-10-CM | POA: Diagnosis not present

## 2018-01-18 DIAGNOSIS — R05 Cough: Secondary | ICD-10-CM | POA: Diagnosis not present

## 2018-01-18 LAB — CBC
HEMATOCRIT: 40.8 % (ref 36.0–46.0)
HEMOGLOBIN: 13.2 g/dL (ref 12.0–15.0)
MCH: 29 pg (ref 26.0–34.0)
MCHC: 32.4 g/dL (ref 30.0–36.0)
MCV: 89.7 fL (ref 80.0–100.0)
Platelets: 185 10*3/uL (ref 150–400)
RBC: 4.55 MIL/uL (ref 3.87–5.11)
RDW: 13.7 % (ref 11.5–15.5)
WBC: 11.4 10*3/uL — AB (ref 4.0–10.5)
nRBC: 0 % (ref 0.0–0.2)

## 2018-01-18 LAB — BASIC METABOLIC PANEL
Anion gap: 11 (ref 5–15)
BUN: 14 mg/dL (ref 6–20)
CHLORIDE: 101 mmol/L (ref 98–111)
CO2: 26 mmol/L (ref 22–32)
CREATININE: 0.92 mg/dL (ref 0.44–1.00)
Calcium: 9.3 mg/dL (ref 8.9–10.3)
GFR calc Af Amer: >60 mL/min (ref >60)
GFR calc non Af Amer: >60 mL/min (ref >60)
Glucose, Bld: 101 mg/dL — ABNORMAL HIGH (ref 70–99)
POTASSIUM: 3.9 mmol/L (ref 3.5–5.1)
SODIUM: 138 mmol/L (ref 135–145)

## 2018-01-18 LAB — TROPONIN I: Troponin I: <0.03 ng/mL (ref <0.03)

## 2018-01-18 NOTE — ED Notes (Signed)
Pt states that she is leaving. Pt encouraged to stay and be see for her concerns. Pt states "ya'll done got what you need from me and I ain't staying and waiting so I'll just check on my results later". Pt again encouraged to stay. Pt unwilling and walked out.

## 2018-01-18 NOTE — Telephone Encounter (Signed)
Patient called to ask for test results.  She said she had to leave.  Says her pcp at [facility] did not have appt today, so she went to urgent care and was sent here. I explaind that she needs to call her pcp and let them know  Her symptoms as they could still be from her heart.  She agrees.

## 2018-01-18 NOTE — ED Notes (Signed)
Pt would not wait and sign form to leave. Pt states that she has been here long enough.

## 2018-01-18 NOTE — ED Triage Notes (Signed)
Pt reports that she has had cough and nasal congestions after her recent surgery on her neck/back. She states that Sunday when she inhaled on her cigarette she had back pain and her chest felt funny. She is worried she may be getting pneumonia.

## 2018-01-22 DIAGNOSIS — M542 Cervicalgia: Secondary | ICD-10-CM | POA: Diagnosis not present

## 2018-01-25 ENCOUNTER — Encounter: Payer: Self-pay | Admitting: Nurse Practitioner

## 2018-01-25 ENCOUNTER — Ambulatory Visit (INDEPENDENT_AMBULATORY_CARE_PROVIDER_SITE_OTHER): Payer: 59 | Admitting: Nurse Practitioner

## 2018-01-25 ENCOUNTER — Other Ambulatory Visit: Payer: Self-pay

## 2018-01-25 VITALS — BP 114/71 | HR 80 | Temp 98.6°F | Ht 71.0 in | Wt 182.8 lb

## 2018-01-25 DIAGNOSIS — F418 Other specified anxiety disorders: Secondary | ICD-10-CM

## 2018-01-25 DIAGNOSIS — M7652 Patellar tendinitis, left knee: Secondary | ICD-10-CM

## 2018-01-25 MED ORDER — HYDROXYZINE HCL 25 MG PO TABS: 12.5000 mg | ORAL_TABLET | Freq: Three times a day (TID) | ORAL | 0 refills | Status: DC | PRN

## 2018-01-25 NOTE — Progress Notes (Signed)
Subjective:    Patient ID: Ashley Thornton, female    DOB: 1975-12-13, 42 y.o.   MRN: [id number]  ANTHONELLA KLAUSNER is a 42 y.o. female presenting on 01/25/2018 for Panic Attack (intermittent anxiety attack x 2 weeks. Pt reports x 4 attack since her recent diagnoses of HSV. Rapid heart beat, heart palpations and some times short of breath normally lasting about 20-30 minutes.  ); Back Pain (the pain radiates from the base of the neck down to the mid back x 2 weeks. Pt unsure if its related to menstrual cycle ); and Knee Pain (intermittent left knee pain, swelling, possible related to prolong standing at work x 1 year )   HPI Panic attack with chest pain Patient reports having intermittent anxiety attacks for the last 2 weeks with 4 panic attacks since her most recent HSV2 diagnosis.  These are associated with heart racing, palpitations, shortness of breath and lasts 20-30 minutes.   One day this was accompanied with chest pressure radiating back to middle of her spine.  She went to [facility] urgent care, had EKG performed, and was sent to the ED.  Patient left without being seen at ED after cxr, ekg, BMP, CMP.  She states it was taking too much time.  She does not have the results of that testing. - She had back pain reevaluation on October 21st by Dr. Shon Baton.  He gave her a Toradol injection for pain and prednisone.  Took all of this, but never had full resolution of chest discomfort. Patient had concerns about back pain radiation at that time. - Now presents for additional workup and  Knee pain Patient has intermittent LEFT anterior knee pain, swelling over the last 1 year.  She believes it is from prolonged standing at work.  It is worse after activity.  Social History   Tobacco Use  . Smoking status: Current Every Day Smoker    Types: Cigarettes  . Smokeless tobacco: Never Used  . Tobacco comment: Smokes 3 black and milds every day  Substance Use Topics  . Alcohol use: Yes    Comment:  3x monthly, less than 4 drinks per occurrence  . Drug use: Yes    Frequency: 3.0 times per week    Types: Marijuana    Review of Systems Per HPI unless specifically indicated above     Objective:    BP 114/71   Pulse 80   Temp 98.6 F (37 C) (Oral)   Ht 5\' 11"  (1.803 m)   Wt 182 lb 12.8 oz (82.9 kg)   LMP 12/26/2017   BMI 25.50 kg/m   Wt Readings from Last 3 Encounters:  01/25/18 182 lb 12.8 oz (82.9 kg)  01/03/18 186 lb 12.8 oz (84.7 kg)  08/28/17 186 lb 6.4 oz (84.6 kg)    Physical Exam  Constitutional: She is oriented to person, place, and time. She appears well-developed and well-nourished. No distress.  HENT:  Head: Normocephalic and atraumatic.  Cardiovascular: Normal rate, regular rhythm, S1 normal, S2 normal, normal heart sounds and intact distal pulses.  Pulmonary/Chest: Effort normal and breath sounds normal. No respiratory distress. Chest wall is not dull to percussion. She exhibits tenderness (mildly tender to deep palpation of left parasternal rib spaces). She exhibits no mass, no bony tenderness, no laceration, no crepitus, no edema, no deformity, no swelling and no retraction.  Musculoskeletal:       Right knee: Normal.       Left knee: She exhibits normal  range of motion, no swelling, no effusion, no ecchymosis, no deformity, no laceration, no erythema, normal alignment, no LCL laxity, normal patellar mobility, no bony tenderness, normal meniscus and no MCL laxity. Tenderness found. Patellar tendon tenderness noted.  Neurological: She is alert and oriented to person, place, and time.  Skin: Skin is warm and dry.  Psychiatric: She has a normal mood and affect. Her behavior is normal.  Vitals reviewed.   Results for orders placed or performed in visit on 01/03/18  Herpes simplex virus culture  Result Value Ref Range   MICRO NUMBER: [id number]    SPECIMEN QUALITY: ADEQUATE    Source OTHER (SPECIFY)    STATUS: FINAL    HSV CULTURE: Not isolated   RPR  Result  Value Ref Range   RPR Ser Ql NON-REACTIVE NON-REACTI  HIV Antibody (routine testing w rflx)  Result Value Ref Range   HIV 1&2 Ab, 4th Generation NON-REACTIVE NON-REACTI  Hepatitis C antibody  Result Value Ref Range   Hepatitis C Ab NON-REACTIVE NON-REACTI   SIGNAL TO CUT-OFF 0.02 <1.00  HSV(herpes simplex vrs) 1+2 ab-IgG  Result Value Ref Range   HAV 1 IGG,TYPE SPECIFIC AB <0.90 index   HSV 2 IGG,TYPE SPECIFIC AB >23.00 (H) index  Cervicovaginal ancillary only  Result Value Ref Range   Chlamydia Negative    Neisseria gonorrhea Negative    Trichomonas Negative       Assessment & Plan:   Problem List Items Addressed This Visit    None    Visit Diagnoses    Situational anxiety    -  Primary Patient with increased situational anxiety and panic attacks. Possible association to chest pain.  Anxiety is not likely to need long-term medications, but can consider in future.   - Encouraged good non-pharm stress management strategies.   - Reassured patient that treatments are available for HSV2 suppression.  - START hydroxyzine 25 mg tab.  Take 1/2 - 1 tab up to three times daily prn anxiety.  Cautioned drowsiness.  - Follow-up 1-2 weeks if no improvement.   Relevant Medications   hydrOXYzine (ATARAX/VISTARIL) 25 MG tablet   Patellar tendinitis of left knee     Pain likely self-limited.  Patellar tendinitis is most consistent with symptoms complicated by regular standing/walking on concrete surfaces.  Plan:  1. Treat with OTC pain meds (acetaminophen and ibuprofen/Aleeve).  Discussed alternate dosing and max dosing.  Patient with recent prednisone course and no improvement of knee pain. 2. Apply heat and/or ice to affected area. 3. May also apply a muscle rub with lidocaine or lidocaine patch after heat or ice. 4. Continue RICE therapies. 5. Follow up 1-2 weeks if no improvement.  Can consider physical therapy after patella rehab exercises as provided in patient handout.    Relevant  Medications   ketorolac (TORADOL) 30 MG/ML injection   methylPREDNISolone (MEDROL DOSEPAK) 4 MG TBPK tablet      Meds ordered this encounter  Medications  . hydrOXYzine (ATARAX/VISTARIL) 25 MG tablet    Sig: Take 0.5-1 tablets (12.5-25 mg total) by mouth 3 (three) times daily as needed for anxiety.    Dispense:  30 tablet    Refill:  0    Order Specific Question:   Supervising Provider    Answer:   Smitty Cords [[id number]]   Follow up plan: Return 7-14 days if symptoms worsen or fail to improve.  [name], DNP, AGPCNP-BC Adult Gerontology Primary Care Nurse Practitioner [place] [facility] 01/25/2018, 12:01 PM

## 2018-01-25 NOTE — Patient Instructions (Addendum)
Ashley Thornton,   Thank you for coming in to clinic today.  1. You may have had bronchitis or pneumonia with your chest discomfort.  If fevers continue, productive cough continues call clinic.  We will prescribe antibiotics and / or repeat Xray. - Start Mucinex for congestion or Mucinex-DM for cough.  2. For your knee pain: - Continue ice, rest, compression, elevation.  Continue pain meds for neck also for your knee.  Try adding Advil/ibuprofen or Aleeve/naproxen. Take either with food and limit to no more than 2 weeks. - Start patella exercises as below.   Mention also to Physical Therapy.  Please schedule a follow-up appointment with [name], AGNP. Return 7-14 days if symptoms worsen or fail to improve.  If you have any other questions or concerns, please feel free to call the clinic or send a message through MyChart. You may also schedule an earlier appointment if necessary.  You will receive a survey after today's visit either digitally by e-mail or paper by [employer]. Your experiences and feedback matter to [place].  Please respond so we know how we are doing as we provide care for you.   [name], DNP, AGNP-BC Adult Gerontology Nurse Practitioner [facility], [facility]   Patellar Tendinitis Rehab Ask your health care provider which exercises are safe for you. Do exercises exactly as told by your health care provider and adjust them as directed. It is normal to feel mild stretching, pulling, tightness, or discomfort as you do these exercises, but you should stop right away if you feel sudden pain or your pain gets worse.Do not begin these exercises until told by your health care provider. Stretching and range of motion exercises This exercise warms up your muscles and joints and improves the movement and flexibility of your knee. This exercise also helps to relieve pain and stiffness. Exercise A: Hamstring, doorway  1. Lie on your back in front of a doorway with  your __________ leg resting against the wall and your other leg flat on the floor in the doorway. There should be a slight bend in your __________ knee. 2. Straighten your __________ knee. You should feel a stretch behind your knee or thigh. If you do not, scoot your buttocks closer to the door. 3. Hold this position for __________ seconds. Repeat __________ times. Complete this stretch __________ times a day. Strengthening exercises These exercises build strength and endurance in your knee. Endurance is the ability to use your muscles for a long time, even after they get tired. Exercise B: Quadriceps, isometric  1. Lie on your back with your __________ leg extended and your other knee bent. 2. Slowly tense the muscles in the front of your __________ thigh. When you do this, you should see your kneecap slide up toward your hip or see increased dimpling just above the knee. This motion will push the back of your knee toward the floor. If this is painful, try putting a rolled-up hand towel under your knee to support it in a bent position. Change the size of the towel to find a position that allows you to do this exercise without any pain. 3. For __________ seconds, hold the muscle as tight as you can without increasing your pain. 4. Relax the muscles slowly and completely. Repeat __________ times. Complete this exercise __________ times a day. Exercise C: Straight leg raises ( quadriceps) 1. Lie on your back with your __________ leg extended and your other knee bent. 2. Tense the  muscles in the front of your __________ thigh. When you do this, you should see your kneecap slide up or see increased dimpling just above the knee. 3. Keep these muscles tight as you raise your leg 4-6 inches (10-15 cm) off the floor. Do not let your moving knee bend. 4. Hold this position for __________ seconds. 5. Keep these muscles tense as you slowly lower your leg. 6. Relax your muscles slowly and completely. Repeat  __________ times. Complete this exercise __________ times a day. Exercise D: Squats 1. Stand in front of a table, with your feet and knees pointing straight ahead. You may rest your hands on the table for balance but not for support. 2. Slowly bend your knees and lower your hips like you are going to sit in a chair. ? Keep your weight over your heels, not over your toes. ? Keep your lower legs upright so they are parallel with the table legs. ? Do not let your hips go lower than your knees. ? Do not bend lower than told by your health care provider. ? If your knee pain increases, do not bend as low. 3. Hold the squat position for __________ seconds. 4. Slowly push with your legs to return to standing. Do not use your hands to pull yourself to standing. Repeat __________ times. Complete this exercise __________ times a day. Exercise E: Step-downs 1. Stand on the edge of a step. 2. Keeping your weight over your __________ heel, slowly bend your __________ knee to bring your __________ heel toward the floor. Lower your heel as far as you can while keeping control and without increasing any discomfort. ? Do not let your __________ knee come forward. ? Use your leg muscles, not gravity, to lower your body. ? Hold a wall or rail for balance if needed. 3. Slowly push through your heel to lift your body weight back up. 4. Return to the starting position. Repeat __________ times. Complete this exercise __________ times a day. Exercise F: Straight leg raises ( hip abductors) 1. Lie on your side with your __________ leg in the top position. Lie so your head, shoulder, knee, and hip line up. You may bend your lower knee to help you keep your balance. 2. Roll your hips slightly forward, so that your hips are stacked directly over each other and your __________ knee is facing forward. 3. Leading with your heel, lift your top leg 4-6 inches (10-15 cm). You should feel the muscles in your outer hip  lifting. ? Do not let your foot drift forward. ? Do not let your knee roll toward the ceiling. 4. Hold this position for __________ seconds. 5. Slowly lower your leg to the starting position. 6. Let your muscles relax completely after each repetition. Repeat __________ times. Complete this exercise __________ times a day. This information is not intended to replace advice given to you by your health care provider. Make sure you discuss any questions you have with your health care provider. Document Released: 03/14/2005 Document Revised: 11/19/2015 Document Reviewed: 12/16/2014 Elsevier Interactive Patient Education  2018 [employer].   Patellofemoral Pain Syndrome Patellofemoral pain syndrome is a condition that involves a softening or breakdown of the tissue (cartilage) on the underside of your kneecap (patella). This causes pain in the front of the knee. The condition is also called runner's knee or chondromalacia patella. Patellofemoral pain syndrome is most common in young adults who are active in sports. Your knee is the largest joint in your body.  The patella covers the front of your knee and is attached to muscles above and below your knee. The underside of the patella is covered with a smooth type of cartilage (synovium). The smooth surface helps the patella glide easily when you move your knee. Patellofemoral pain syndrome causes swelling in the joint linings and bone surfaces in your knee. What are the causes? Patellofemoral pain syndrome can be caused by:  Overuse.  Poor alignment of your knee joints.  Weak leg muscles.  A direct blow to your kneecap.  What increases the risk? You may be at risk for patellofemoral pain syndrome if you:  Do a lot of activities that can wear down your kneecap. These include: ? Running. ? Squatting. ? Climbing stairs.  Start a new physical activity or exercise program.  Wear shoes that do not fit well.  Do not have good leg  strength.  Are overweight.  What are the signs or symptoms? Knee pain is the most common symptom of patellofemoral pain syndrome. This may feel like a dull, aching pain underneath your patella, in the front of your knee. There may be a popping or cracking sound when you move your knee. Pain may get worse with:  Exercise.  Climbing stairs.  Running.  Jumping.  Squatting.  Kneeling.  Sitting for a long time.  Moving or pushing on your patella.  How is this diagnosed? Your health care provider may be able to diagnose patellofemoral pain syndrome from your symptoms and medical history. You may be asked about your recent physical activities and which ones cause knee pain. Your health care provider may do a physical exam with certain tests to confirm the diagnosis. These may include:  Moving your patella back and forth.  Checking your range of knee motion.  Having you squat or jump to see if you have pain.  Checking the strength of your leg muscles.  An MRI of the knee may also be done. How is this treated? Patellofemoral pain syndrome can usually be treated at home with rest, ice, compression, and elevation (RICE). Other treatments may include:  Nonsteroidal anti-inflammatory drugs (NSAIDs).  Physical therapy to stretch and strengthen your leg muscles.  Shoe inserts (orthotics) to take stress off your knee.  A knee brace or knee support.  Surgery to remove damaged cartilage or move the patella to a better position. The need for surgery is rare.  Follow these instructions at home:  Take medicines only as directed by your health care provider.  Rest your knee. ? When resting, keep your knee raised above the level of your heart. ? Avoid activities that cause knee pain.  Apply ice to the injured area: ? Put ice in a plastic bag. ? Place a towel between your skin and the bag. ? Leave the ice on for 20 minutes, 2-3 times a day.  Use splints, braces, knee supports, or  walking aids as directed by your health care provider.  Perform stretching and strengthening exercises as directed by your health care provider or physical therapist.  Keep all follow-up visits as directed by your health care provider. This is important. Contact a health care provider if:  Your symptoms get worse.  You are not improving with home care. This information is not intended to replace advice given to you by your health care provider. Make sure you discuss any questions you have with your health care provider. Document Released: 03/02/2009 Document Revised: 08/20/2015 Document Reviewed: 06/03/2013 Elsevier Interactive Patient Education  2018  [employer].

## 2018-01-31 ENCOUNTER — Encounter: Payer: Self-pay | Admitting: Nurse Practitioner

## 2018-02-05 DIAGNOSIS — M5382 Other specified dorsopathies, cervical region: Secondary | ICD-10-CM | POA: Diagnosis not present

## 2018-02-05 DIAGNOSIS — M47812 Spondylosis without myelopathy or radiculopathy, cervical region: Secondary | ICD-10-CM | POA: Diagnosis not present

## 2018-02-05 DIAGNOSIS — M5030 Other cervical disc degeneration, unspecified cervical region: Secondary | ICD-10-CM | POA: Diagnosis not present

## 2018-02-19 DIAGNOSIS — M47812 Spondylosis without myelopathy or radiculopathy, cervical region: Secondary | ICD-10-CM | POA: Diagnosis not present

## 2018-03-09 ENCOUNTER — Other Ambulatory Visit: Payer: Self-pay | Admitting: Nurse Practitioner

## 2018-03-09 DIAGNOSIS — M542 Cervicalgia: Secondary | ICD-10-CM | POA: Diagnosis not present

## 2018-03-09 DIAGNOSIS — F418 Other specified anxiety disorders: Secondary | ICD-10-CM

## 2018-03-09 MED ORDER — HYDROXYZINE HCL 25 MG PO TABS: 12.5000 mg | ORAL_TABLET | Freq: Three times a day (TID) | ORAL | 3 refills | Status: DC | PRN

## 2018-03-09 NOTE — Telephone Encounter (Signed)
Pt needs a refill on hydroxyzine sent to [facility]

## 2018-03-16 DIAGNOSIS — M542 Cervicalgia: Secondary | ICD-10-CM | POA: Diagnosis not present

## 2018-03-19 DIAGNOSIS — M542 Cervicalgia: Secondary | ICD-10-CM | POA: Diagnosis not present

## 2018-03-26 DIAGNOSIS — M5382 Other specified dorsopathies, cervical region: Secondary | ICD-10-CM | POA: Diagnosis not present

## 2018-03-26 DIAGNOSIS — M5030 Other cervical disc degeneration, unspecified cervical region: Secondary | ICD-10-CM | POA: Diagnosis not present

## 2018-03-26 DIAGNOSIS — M47812 Spondylosis without myelopathy or radiculopathy, cervical region: Secondary | ICD-10-CM | POA: Diagnosis not present

## 2018-04-02 DIAGNOSIS — Z981 Arthrodesis status: Secondary | ICD-10-CM | POA: Diagnosis not present

## 2018-04-02 HISTORY — DX: Arthrodesis status: Z98.1

## 2018-04-27 DIAGNOSIS — M6281 Muscle weakness (generalized): Secondary | ICD-10-CM | POA: Diagnosis not present

## 2018-04-27 DIAGNOSIS — M542 Cervicalgia: Secondary | ICD-10-CM | POA: Diagnosis not present

## 2018-05-02 DIAGNOSIS — M5030 Other cervical disc degeneration, unspecified cervical region: Secondary | ICD-10-CM | POA: Diagnosis not present

## 2018-05-02 DIAGNOSIS — M47812 Spondylosis without myelopathy or radiculopathy, cervical region: Secondary | ICD-10-CM | POA: Diagnosis not present

## 2018-05-02 DIAGNOSIS — G894 Chronic pain syndrome: Secondary | ICD-10-CM | POA: Diagnosis not present

## 2018-05-15 DIAGNOSIS — Z981 Arthrodesis status: Secondary | ICD-10-CM | POA: Diagnosis not present

## 2018-05-15 DIAGNOSIS — M542 Cervicalgia: Secondary | ICD-10-CM | POA: Diagnosis not present

## 2018-06-20 ENCOUNTER — Telehealth: Payer: Self-pay | Admitting: Nurse Practitioner

## 2018-06-20 NOTE — Telephone Encounter (Signed)
Pt needs something for last years physical and tdap shot.  Her call back number is [phone]

## 2018-06-20 NOTE — Telephone Encounter (Signed)
Attempted to contact the pt to see if she would like for me to mail out her a copy of her AVS from 08/28/2017.

## 2018-07-18 ENCOUNTER — Telehealth: Payer: Self-pay | Admitting: Nurse Practitioner

## 2018-07-18 DIAGNOSIS — F418 Other specified anxiety disorders: Secondary | ICD-10-CM

## 2018-07-18 MED ORDER — HYDROXYZINE HCL 25 MG PO TABS: 12.5000 mg | ORAL_TABLET | Freq: Three times a day (TID) | ORAL | 3 refills | Status: DC | PRN

## 2018-07-18 NOTE — Telephone Encounter (Signed)
Pt called requesting refill for hydroxyzine

## 2018-08-06 DIAGNOSIS — M461 Sacroiliitis, not elsewhere classified: Secondary | ICD-10-CM | POA: Diagnosis not present

## 2018-08-27 ENCOUNTER — Ambulatory Visit: Payer: Self-pay | Admitting: Obstetrics and Gynecology

## 2018-08-29 NOTE — Progress Notes (Signed)
Gynecology Abnormal Uterine Bleeding Initial Evaluation   Chief Complaint: No chief complaint on file.   History of Present Illness:     Last Pap results esults were obtained 08/28/2017 NIL and HR HPV negative   Currently being evaluated for renal transplant donor?   Review of Systems: ROS  Past Medical History:  Past Medical History:  Diagnosis Date  . Anxiety   . Depression   . Headache   . Heart palpitations   . History of ectopic pregnancy 2006   single fallopian tube remains  . Ovarian cyst    right  . Pelvic pain in female   . Ruptured ear drum, left 2004   hearing returned  . Tinnitus aurium, bilateral     Past Surgical History:  Past Surgical History:  Procedure Laterality Date  . BACK SURGERY  2005   L4-L5 slipped disc repair  . KNEE SURGERY Right 2010   Broken knee, leg, ankle - rods and screws remain    Obstetric History: No obstetric history on file.  Family History:  No family history on file.  Social History:  Social History   Socioeconomic History  . Marital status: Single    Spouse name: Not on file  . Number of children: Not on file  . Years of education: Not on file  . Highest education level: Not on file  Occupational History  . Not on file  Social Needs  . Financial resource strain: Not on file  . Food insecurity:    Worry: Not on file    Inability: Not on file  . Transportation needs:    Medical: Not on file    Non-medical: Not on file  Tobacco Use  . Smoking status: Current Every Day Smoker    Types: Cigarettes  . Smokeless tobacco: Never Used  . Tobacco comment: Smokes 3 black and milds every day  Substance and Sexual Activity  . Alcohol use: Yes    Comment: 3x monthly, less than 4 drinks per occurrence  . Drug use: Yes    Frequency: 3.0 times per week    Types: Marijuana  . Sexual activity: Yes    Birth control/protection: Condom  Lifestyle  . Physical activity:    Days per week: Not on file    Minutes per  session: Not on file  . Stress: Not on file  Relationships  . Social connections:    Talks on phone: Not on file    Gets together: Not on file    Attends religious service: Not on file    Active member of club or organization: Not on file    Attends meetings of clubs or organizations: Not on file    Relationship status: Not on file  . Intimate partner violence:    Fear of current or ex partner: Not on file    Emotionally abused: Not on file    Physically abused: Not on file    Forced sexual activity: Not on file  Other Topics Concern  . Not on file  Social History Narrative  . Not on file    Allergies:  Allergies  Allergen Reactions  . Codeine     SOB, SWEATING, FAINT  . Other     Medications: Prior to Admission medications   Medication Sig Start Date End Date Taking? Authorizing Provider  acetaminophen (TYLENOL) 500 MG tablet Take 1,000 mg by mouth daily as needed.    [provider]  hydrOXYzine (ATARAX/VISTARIL) 25 MG tablet Take  0.5-1 tablets (12.5-25 mg total) by mouth 3 (three) times daily as needed for anxiety. 07/18/18   Karamalegos, Netta Neat, DO  valACYclovir (VALTREX) 1000 MG tablet valacyclovir 1 gram tablet  TAKE 1 TABLET BY MOUTH TWICE DAILY FOR 7 DAYS    [provider]    Physical Exam There were no vitals taken for this visit.   No LMP recorded.  TVUS 09/12/2017 showing left hydrosalpinx   Assessment: 43 y.o. No obstetric history on file. with abnormal uterine bleeding  Plan: Problem List Items Addressed This Visit    None     No show for appointment    Vena Austria, MD, [facility], [facility] 08/29/2018, 6:35 PM

## 2018-08-30 ENCOUNTER — Other Ambulatory Visit: Payer: Self-pay

## 2018-08-30 ENCOUNTER — Ambulatory Visit (INDEPENDENT_AMBULATORY_CARE_PROVIDER_SITE_OTHER): Payer: 59 | Admitting: Obstetrics and Gynecology

## 2018-08-30 DIAGNOSIS — Z5329 Procedure and treatment not carried out because of patient's decision for other reasons: Secondary | ICD-10-CM

## 2018-08-30 DIAGNOSIS — N7011 Chronic salpingitis: Secondary | ICD-10-CM | POA: Insufficient documentation

## 2018-08-30 HISTORY — DX: Chronic salpingitis: N70.11

## 2019-06-02 IMAGING — MR MR CERVICAL SPINE W/O CM
5 series · 41 of 48 positions shown · non-contrast
Comparison: Prior radiograph from 10/31/2016.

CLINICAL DATA: Initial evaluation for chronic neck pain with left
shoulder pain, worsened over last few months.

EXAM:
MRI CERVICAL SPINE WITHOUT CONTRAST
TECHNIQUE: Multiplanar, multisequence MR imaging of the cervical spine was
performed. No intravenous contrast was administered.

[Series 2: T2 · sagittal · 3.0mm · 0.70mm/px · 8 of 15 slices shown (1 of 2)]
[im 1/15]
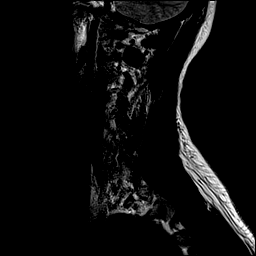
[im 3/15]
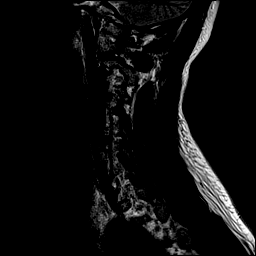
[im 5/15]
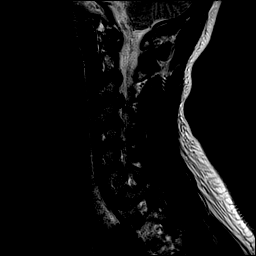
[im 7/15]
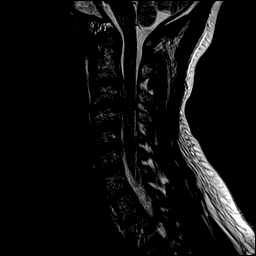
[im 9/15]
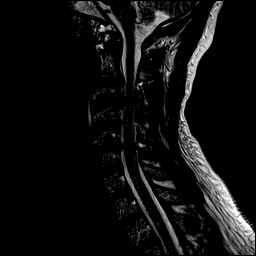
[im 11/15]
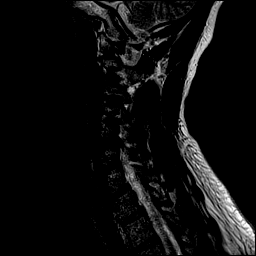
[im 13/15]
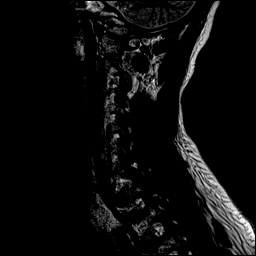
[im 15/15]
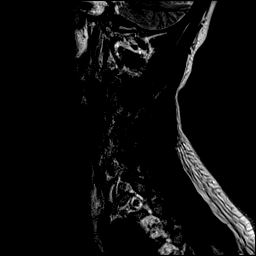

[Series 3: T1 · sagittal · 3.0mm · 0.70mm/px · 7 of 15 slices shown]
[im 1/15]
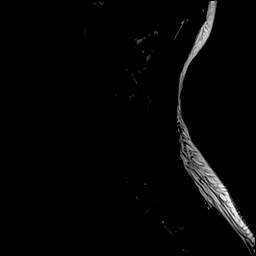
[im 3/15]
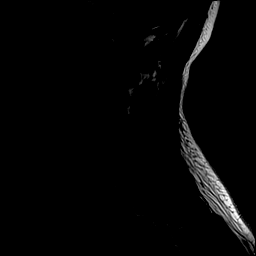
[im 5/15]
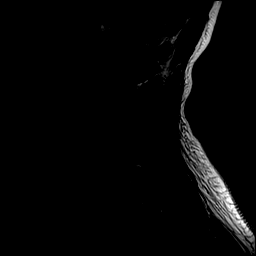
[im 8/15]
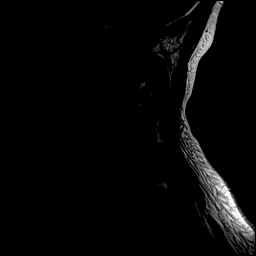
[im 10/15]
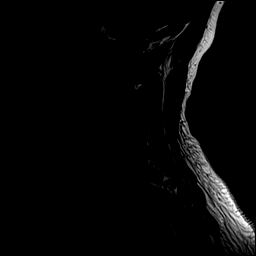
[im 12/15]
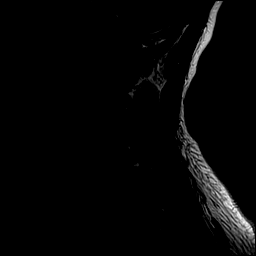
[im 15/15]
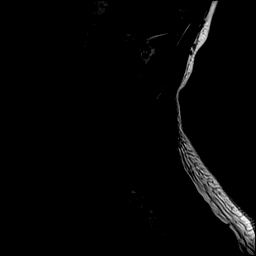

[Series 4: STIR · sagittal · 3.0mm · 0.70mm/px · 7 of 15 slices shown]
[im 1/15]
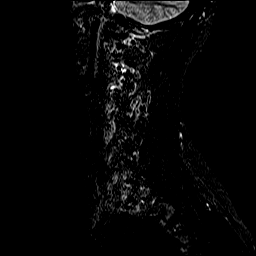
[im 3/15]
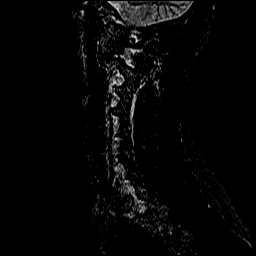
[im 5/15]
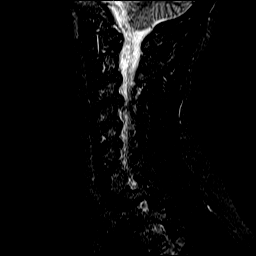
[im 8/15]
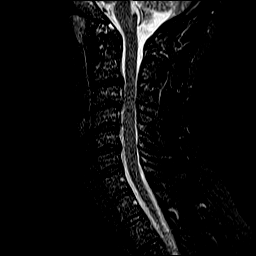
[im 10/15]
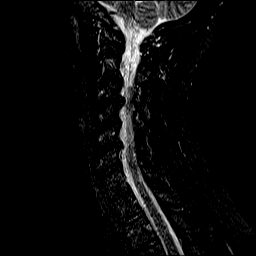
[im 12/15]
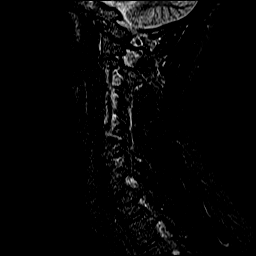
[im 15/15]
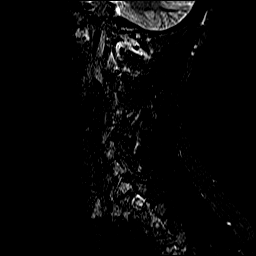

[Series 5: T2 · axial · 3.0mm · 0.70mm/px · z∈[-102,-1]mm · 11 of 27 slices shown (2 of 2)]
[im 1/27]
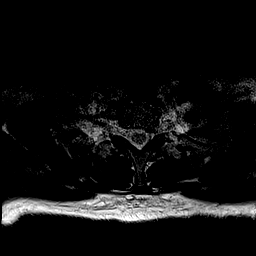
[im 3/27]
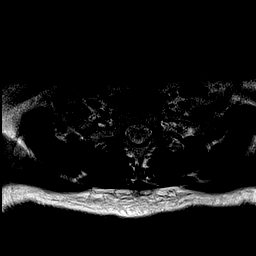
[im 5/27]
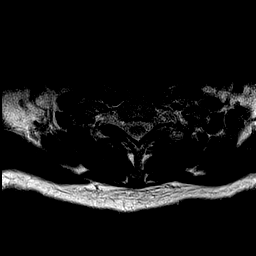
[im 7/27]
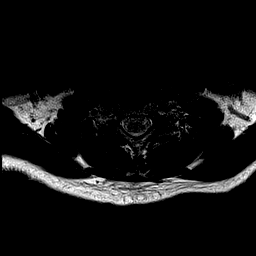
[im 9/27]
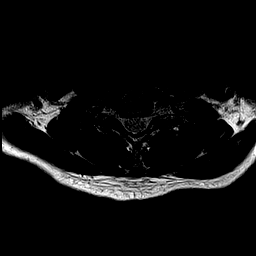
[im 11/27]
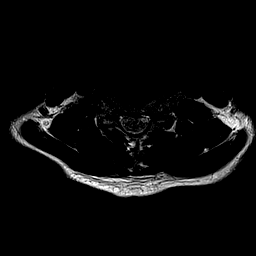
[im 14/27]
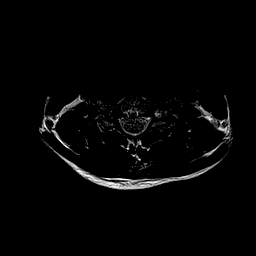
[im 16/27]
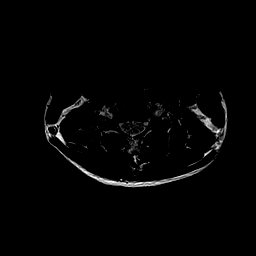
[im 18/27]
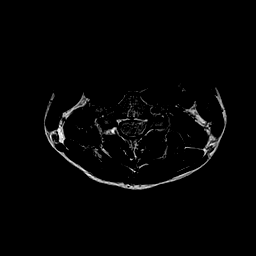
[im 22/27]
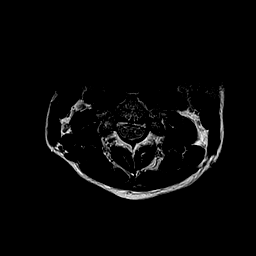
[im 27/27]
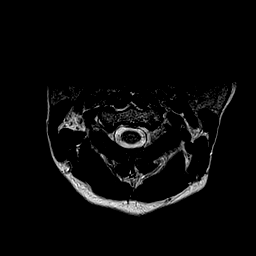

[Series 6: mpgr ax · axial · 3.0mm · 0.35mm/px · z∈[-102,-1]mm · 8 of 27 slices shown]
[im 1/27]
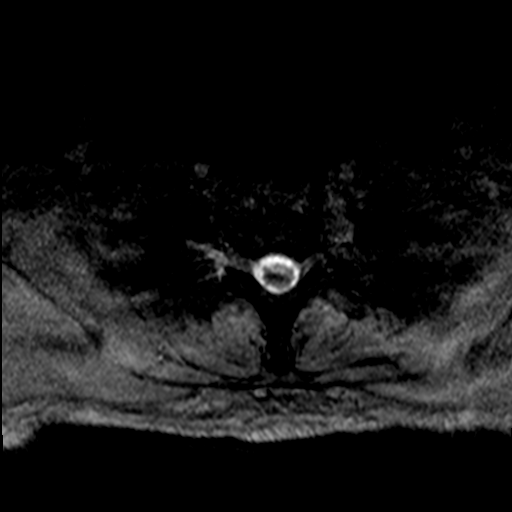
[im 5/27]
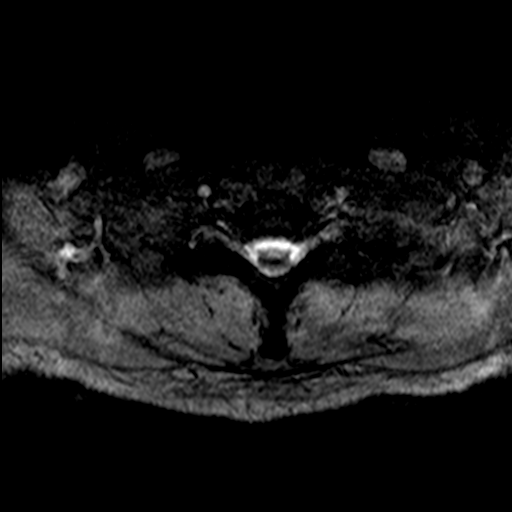
[im 9/27]
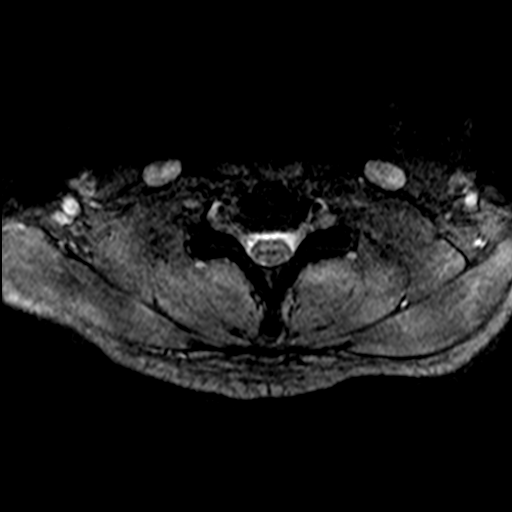
[im 11/27]
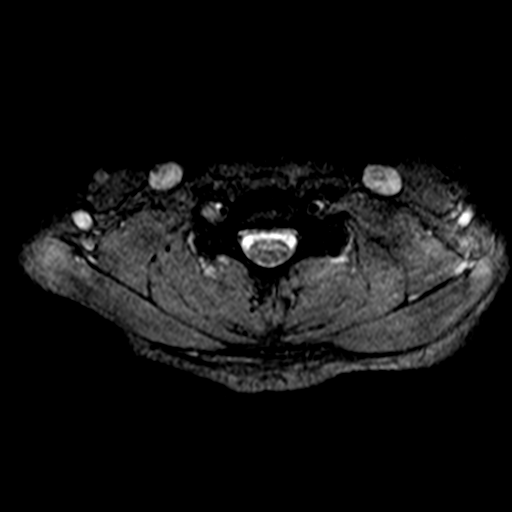
[im 16/27]
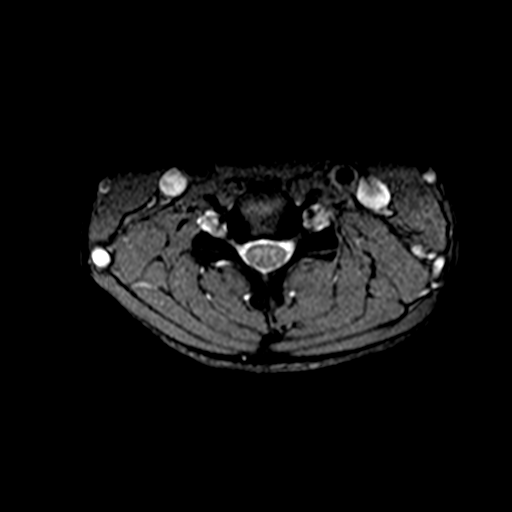
[im 18/27]
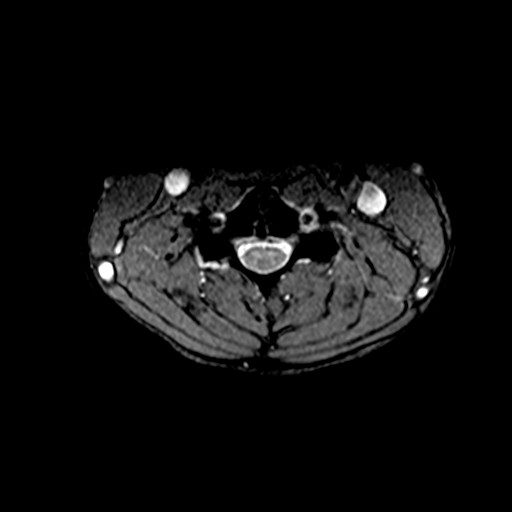
[im 22/27]
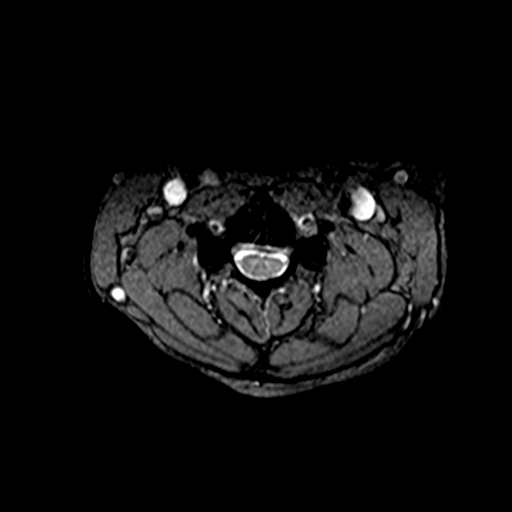
[im 27/27]
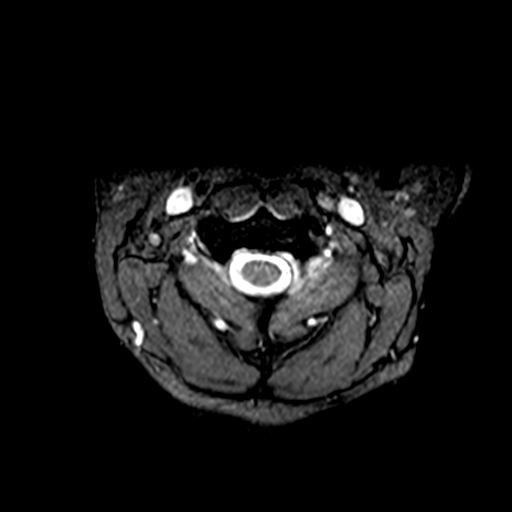

[41 of 48 positions shown; findings below may reference images not displayed]

FINDINGS: Alignment: Examination mildly limited by motion artifact.

Straightening of the normal cervical lordosis, stable. No listhesis
or malalignment.

Vertebrae: Vertebral body heights are maintained without acute or
chronic fracture. Signal intensity within the vertebral body bone
marrow is within normal limits. No discrete or worrisome osseous
lesions. No abnormal marrow edema.

Cord: Is signal intensity within the cervical spinal cord is normal.

Posterior Fossa, vertebral arteries, paraspinal tissues: Visualized
brain and posterior fossa within normal limits. Craniocervical
junction normal. Paraspinous and prevertebral soft tissues within
normal limits. Normal intravascular flow voids present within the
vertebral arteries bilaterally.

Disc levels:

C2-C3: Left eccentric disc osteophyte complex with bilateral
uncovertebral hypertrophy. Flattening and partial effacement of the
ventral thecal sac, greater on the left. Mild flattening the left
hemi cord without cord signal changes. Mild spinal stenosis with
borderline mild left C3 foraminal narrowing. No significant right
foraminal encroachment.

C3-C4: Chronic diffuse degenerative disc osteophyte with bilateral
uncovertebral spurring. Broad posterior component flattens and
effaces the ventral thecal sac. Secondary cord flattening without
cord signal changes. Moderate spinal stenosis. Severe bilateral C4
foraminal stenosis.

C4-C5: Mild diffuse disc bulge with bilateral uncovertebral
hypertrophy. No significant spinal stenosis. Moderate bilateral C5
foraminal narrowing, right worse than left.

C5-C6: Mild diffuse disc bulge with bilateral uncovertebral
spurring. Mild facet hypertrophy. Resultant mild spinal stenosis
without significant cord deformity. Moderate to severe bilateral C6
foraminal narrowing, slightly worse on the left.

C6-C7:  Mild disc bulge.  No canal or neural foraminal stenosis.

C7-T1: Mild left-sided facet hypertrophy. No canal or neural
foraminal stenosis.

Visualized upper thoracic spine demonstrates no significant
findings.
IMPRESSION: 1. Multilevel cervical spondylolysis involving the upper cervical
spine with resultant mild to moderate diffuse spinal stenosis at
C2-3 through C5-6, most severe at the C3-4 level.
2. Multifactorial degenerative changes with resultant multilevel
foraminal narrowing as above. Notable findings include severe
bilateral C4 narrowing, moderate bilateral C5 foraminal stenosis,
with moderate to severe bilateral C6 foraminal narrowing.

## 2019-08-02 ENCOUNTER — Ambulatory Visit: Payer: Self-pay | Attending: Internal Medicine

## 2019-08-02 DIAGNOSIS — Z23 Encounter for immunization: Secondary | ICD-10-CM

## 2019-08-02 NOTE — Progress Notes (Signed)
   Covid-19 Vaccination Clinic  Name:  ALYZA ARTIAGA    MRN: [id number] DOB: 03/19/1976  08/02/2019  Ms. Porada was observed post Covid-19 immunization for 15 minutes without incident. She was provided with Vaccine Information Sheet and instruction to access the V-Safe system.   Ms. Arciniega was instructed to call 911 with any severe reactions post vaccine: Marland Kitchen Difficulty breathing  . Swelling of face and throat  . A fast heartbeat  . A bad rash all over body  . Dizziness and weakness   Immunizations Administered    Name Date Dose VIS Date Route   Pfizer COVID-19 Vaccine 08/02/2019  9:59 AM 0.3 mL 05/22/2018 Intramuscular   Manufacturer: [employer], [employer]   Lot: [id number]   NDC: [address]-2

## 2019-08-27 ENCOUNTER — Ambulatory Visit: Payer: Self-pay | Attending: Internal Medicine

## 2019-08-27 DIAGNOSIS — Z23 Encounter for immunization: Secondary | ICD-10-CM

## 2019-08-27 NOTE — Progress Notes (Signed)
   Covid-19 Vaccination Clinic  Name:  Ashley Thornton    MRN: [id number] DOB: 23-Apr-1975  08/27/2019  [name] was observed post Covid-19 immunization for 15 minutes without incident. She was provided with Vaccine Information Sheet and instruction to access the V-Safe system.   [name] was instructed to call 911 with any severe reactions post vaccine: Marland Kitchen Difficulty breathing  . Swelling of face and throat  . A fast heartbeat  . A bad rash all over body  . Dizziness and weakness   Immunizations Administered    Name Date Dose VIS Date Route   Pfizer COVID-19 Vaccine 08/27/2019  9:49 AM 0.3 mL 05/22/2018 Intramuscular   Manufacturer: [employer], [employer]   Lot: [id number]   NDC: [address]-5

## 2020-03-28 ENCOUNTER — Emergency Department (HOSPITAL_COMMUNITY): Payer: Self-pay

## 2020-03-28 ENCOUNTER — Emergency Department (HOSPITAL_COMMUNITY)
Admission: EM | Admit: 2020-03-28 | Discharge: 2020-03-28 | Disposition: A | Discharge status: Home / Self Care | Payer: Self-pay | Attending: Emergency Medicine | Admitting: Emergency Medicine

## 2020-03-28 ENCOUNTER — Encounter (HOSPITAL_COMMUNITY): Payer: Self-pay | Admitting: Emergency Medicine

## 2020-03-28 ENCOUNTER — Other Ambulatory Visit: Payer: Self-pay

## 2020-03-28 DIAGNOSIS — Z20822 Contact with and (suspected) exposure to covid-19: Secondary | ICD-10-CM | POA: Insufficient documentation

## 2020-03-28 DIAGNOSIS — R5383 Other fatigue: Secondary | ICD-10-CM | POA: Insufficient documentation

## 2020-03-28 DIAGNOSIS — F17210 Nicotine dependence, cigarettes, uncomplicated: Secondary | ICD-10-CM | POA: Insufficient documentation

## 2020-03-28 LAB — RESP PANEL BY RT-PCR (FLU A&B, COVID) ARPGX2
Influenza A by PCR: NEGATIVE
Influenza B by PCR: NEGATIVE
SARS Coronavirus 2 by RT PCR: NEGATIVE

## 2020-03-28 NOTE — ED Notes (Signed)
Pt wanting to leave. States she is not waiting. Advised I could talk to provider that she saw. Pt just walked out instead. Nad.

## 2020-03-28 NOTE — ED Notes (Signed)
See triage notes. Nad. Pt only complaint today is fatigue. Color wnl.

## 2020-03-28 NOTE — ED Provider Notes (Signed)
[facility] EMERGENCY DEPARTMENT Provider Note   CSN: [id number] Arrival date & time: 03/28/20  0820     History Chief Complaint  Patient presents with  . Fatigue    Ashley Thornton is a 45 y.o. female.  Ashley Thornton is a 45 y.o. female with a history of anxiety, depression, palpitations, ovarian cyst, who presents to the emergency department for evaluation of fatigue.  Patient reports she had a known Covid exposure on 12/23, a few days later had a sore throat and low-grade fever, she took some vitamins and supplements and sore throat seem to resolve but since then she is just felt very fatigued.  No additional fevers or chills.  Denies body aches.  Denies rhinorrhea or cough.  Denies any loss of taste or smell.  No shortness of breath.  She does report that after eating on Sunday and last night she had some mild chest discomfort she described as indigestion that she is experienced before but this resolved on its own and she has had no additional symptoms today.  Pain was nonradiating, not associated with lightheadedness or syncope.  Not worse with exertion or breathing.  She has had 2 doses of Pfizer vaccine, has not yet had a booster.  Try to get a Covid test as an outpatient but the soonest she could get tested was 1/6.  Works in [employer] as a [employer].  No other aggravating or alleviating factors.  The history is provided by the patient.       Past Medical History:  Diagnosis Date  . Anxiety   . Depression   . Headache   . Heart palpitations   . History of ectopic pregnancy 2006   single fallopian tube remains  . Ovarian cyst    right  . Pelvic pain in female   . Ruptured ear drum, left 2004   hearing returned  . Tinnitus aurium, bilateral     Patient Active Problem List   Diagnosis Date Noted  . Hydrosalpinx 08/30/2018  . Neuropathy, cervical (radicular) 05/23/2017  . DDD (degenerative disc disease), cervical 05/23/2017  . Tobacco use 02/10/2014    Past Surgical  History:  Procedure Laterality Date  . ACDF N/A    cervical  . BACK SURGERY  2005   L4-L5 slipped disc repair  . KNEE SURGERY Right 2010   Broken knee, leg, ankle - rods and screws remain     OB History   No obstetric history on file.     History reviewed. No pertinent family history.  Social History   Tobacco Use  . Smoking status: Current Every Day Smoker    Types: Cigarettes  . Smokeless tobacco: Never Used  . Tobacco comment: Smokes 3 black and milds every day  Vaping Use  . Vaping Use: Never used  Substance Use Topics  . Alcohol use: Yes    Comment: 3x monthly, less than 4 drinks per occurrence  . Drug use: Yes    Frequency: 3.0 times per week    Types: Marijuana    Comment: occasional    Home Medications Prior to Admission medications   Medication Sig Start Date End Date Taking? Authorizing Provider  acetaminophen (TYLENOL) 500 MG tablet Take 1,000 mg by mouth daily as needed.    [provider]  hydrOXYzine (ATARAX/VISTARIL) 25 MG tablet Take 0.5-1 tablets (12.5-25 mg total) by mouth 3 (three) times daily as needed for anxiety. 07/18/18   Karamalegos, Devonne Doughty, DO  valACYclovir (VALTREX) 1000 MG tablet  valacyclovir 1 gram tablet  TAKE 1 TABLET BY MOUTH TWICE DAILY FOR 7 DAYS    [provider]    Allergies    Codeine and Other  Review of Systems   Review of Systems  Constitutional: Positive for chills and fatigue.  HENT: Positive for sore throat. Negative for congestion and rhinorrhea.   Respiratory: Negative for cough and shortness of breath.   Cardiovascular: Negative for chest pain.  Gastrointestinal: Negative for abdominal pain, nausea and vomiting.  Genitourinary: Negative for dysuria and frequency.  Musculoskeletal: Negative for arthralgias and myalgias.  Skin: Negative for color change and rash.  Neurological: Negative for headaches.  All other systems reviewed and are negative.   Physical Exam Updated Vital Signs BP (!)  132/92   Pulse 78   Temp 98.4 F (36.9 C) (Oral)   Resp 17   Ht 5\' 11"  (1.803 m)   Wt 83.9 kg   LMP 03/14/2020 (Approximate)   SpO2 100%   BMI 25.80 kg/m   Physical Exam Vitals and nursing note reviewed.  Constitutional:      General: She is not in acute distress.    Appearance: Normal appearance. She is well-developed and well-nourished. She is not ill-appearing or diaphoretic.  HENT:     Head: Normocephalic and atraumatic.     Nose: Nose normal.     Mouth/Throat:     Mouth: Oropharynx is clear and moist. Mucous membranes are moist.     Pharynx: Oropharynx is clear.     Comments: Posterior oropharynx clear and mucous membranes moist, there is mild erythema but no edema or tonsillar exudates, uvula midline, normal phonation, no trismus, tolerating secretions without difficulty.  Eyes:     General:        Right eye: No discharge.        Left eye: No discharge.     Extraocular Movements: EOM normal.  Cardiovascular:     Rate and Rhythm: Normal rate and regular rhythm.     Pulses: Intact distal pulses.     Heart sounds: Normal heart sounds. No murmur heard. No friction rub. No gallop.   Pulmonary:     Effort: Pulmonary effort is normal. No respiratory distress.     Breath sounds: Normal breath sounds. No wheezing or rales.     Comments: Respirations equal and unlabored, patient able to speak in full sentences, lungs clear to auscultation bilaterally  Abdominal:     General: Bowel sounds are normal. There is no distension.     Palpations: Abdomen is soft. There is no mass.     Tenderness: There is no abdominal tenderness. There is no guarding.  Musculoskeletal:        General: No deformity or edema.     Cervical back: Neck supple.  Skin:    General: Skin is warm and dry.     Capillary Refill: Capillary refill takes less than 2 seconds.  Neurological:     Mental Status: She is alert.     Coordination: Coordination normal.     Comments: Speech is clear, able to follow  commands Moves extremities without ataxia, coordination intact  Psychiatric:        Mood and Affect: Mood normal.        Behavior: Behavior normal.     ED Results / Procedures / Treatments   Labs (all labs ordered are listed, but only abnormal results are displayed) Labs Reviewed  RESP PANEL BY RT-PCR (FLU A&B, COVID) [id number]    EKG  EKG Interpretation  Date/Time:  Saturday March 28 2020 09:33:31 EST Ventricular Rate:  63 PR Interval:    QRS Duration: 77 QT Interval:  407 QTC Calculation: 417 R Axis:   75 Text Interpretation: Sinus rhythm Probable anteroseptal infarct, old Confirmed by Bethann Berkshire [phone]) on 03/28/2020 10:13:25 AM   Radiology DG Chest Port 1 View  Result Date: 03/28/2020 CLINICAL DATA:  45 year old female with a history fatigue and prior COVID exposure EXAM: PORTABLE CHEST 1 VIEW COMPARISON:  01/18/2018 FINDINGS: Cardiomediastinal silhouette unchanged in size and contour. Coarsened interstitial markings similar to the prior. No pneumothorax. No pleural effusion. No confluent airspace disease. No displaced fracture. IMPRESSION: Similar appearance of the lungs to the prior plain film of 2019, with no evidence of acute cardiopulmonary disease Electronically Signed   By: Gilmer Mor D.O.   On: 03/28/2020 10:03    Procedures Procedures (including critical care time)  Medications Ordered in ED Medications - No data to display  ED Course  I have reviewed the triage vital signs and the nursing notes.  Pertinent labs & imaging results that were available during my care of the patient were reviewed by me and considered in my medical decision making (see chart for details).    MDM Rules/Calculators/A&P                          45 year old female presents after known Covid exposure, initially had some sore throat and low-grade fever but is now primarily feeling fatigued.  Had some indigestion briefly on Tuesday and last night but no persistent chest pain or  shortness of breath.  No GI symptoms.  On arrival patient mildly hypertensive but all other vitals normal and patient is well-appearing.  Chest x-ray is clear and EKG is unremarkable.  Suspect possible Covid infection given known exposure, patient is vaccinated.  Covid swab pending.  Was preparing to discharge patient, notified by nursing staff that patient left prior to receiving discharge paperwork.  Will call patient with Covid test results.  Ashley Thornton was evaluated in Emergency Department on 03/28/2020 for the symptoms described in the history of present illness. She was evaluated in the context of the global COVID-19 pandemic, which necessitated consideration that the patient might be at risk for infection with the SARS-CoV-2 virus that causes COVID-19. Institutional protocols and algorithms that pertain to the evaluation of patients at risk for COVID-19 are in a state of rapid change based on information released by regulatory bodies including the CDC and federal and state organizations. These policies and algorithms were followed during the patient's care in the ED.   Final Clinical Impression(s) / ED Diagnoses Final diagnoses:  Fatigue, unspecified type  Suspected COVID-19 virus infection    Rx / DC Orders ED Discharge Orders    None       Legrand Rams 03/28/20 1027    Bethann Berkshire, MD 04/02/20 1038

## 2020-03-28 NOTE — Discharge Instructions (Addendum)
Your chest x-ray is clear today and EKG looks good.  You have a Covid and flu test pending, you cannot return to work until you receive these results, you should continue to quarantine.  Results will be back later today and I will call you if results are positive.

## 2020-03-28 NOTE — ED Triage Notes (Signed)
Pt c/o fatigue with known covid exposure on 03/19/20.  Pt states she had a sore throat with low grade fever 03/24/20, but only c/o fatigue at this time.  Pt would like to be covid tested.

## 2020-04-23 ENCOUNTER — Ambulatory Visit: Payer: Self-pay | Attending: Internal Medicine

## 2020-04-23 DIAGNOSIS — Z23 Encounter for immunization: Secondary | ICD-10-CM

## 2020-04-23 NOTE — Progress Notes (Signed)
   Covid-19 Vaccination Clinic  Name:  Ashley Thornton    MRN: [id number] DOB: June 22, 1975  04/23/2020  Ashley Thornton was observed post Covid-19 immunization for 15 minutes without incident. She was provided with Vaccine Information Sheet and instruction to access the V-Safe system.   Ashley Thornton was instructed to call 911 with any severe reactions post vaccine: Marland Kitchen Difficulty breathing  . Swelling of face and throat  . A fast heartbeat  . A bad rash all over body  . Dizziness and weakness   Immunizations Administered    Name Date Dose VIS Date Route   PFIZER Comrnaty(Gray TOP) Covid-19 Vaccine 04/23/2020  1:05 PM 0.3 mL 03/05/2020 Intramuscular   Manufacturer: [employer], [employer]   Lot: [id number]   NDC: [phone]

## 2021-03-08 ENCOUNTER — Other Ambulatory Visit: Payer: Self-pay

## 2021-03-08 ENCOUNTER — Emergency Department
Admission: EM | Admit: 2021-03-08 | Discharge: 2021-03-08 | Disposition: A | Discharge status: Home / Self Care | Payer: Self-pay | Attending: Emergency Medicine | Admitting: Emergency Medicine

## 2021-03-08 DIAGNOSIS — R229 Localized swelling, mass and lump, unspecified: Secondary | ICD-10-CM

## 2021-03-08 DIAGNOSIS — R2232 Localized swelling, mass and lump, left upper limb: Secondary | ICD-10-CM | POA: Insufficient documentation

## 2021-03-08 DIAGNOSIS — F17210 Nicotine dependence, cigarettes, uncomplicated: Secondary | ICD-10-CM | POA: Insufficient documentation

## 2021-03-08 NOTE — ED Triage Notes (Signed)
Pt c/o swollen area to the left FA for the past 2 weeks, no redness noted, mild tenderness. Denies injury.Marland Kitchen

## 2021-03-08 NOTE — ED Notes (Signed)
Pt to ED for 2cm by 2cm hard, mobile cyst on anterior medial L forearm. She noticed this about 2 weeks ago. Cyst is mildly tender to touch. Pt wanted to make sure it is not a blood clot. Pt also had sharp pain from L neck to L hand yesterday that resolved. Pt had prior neck surgery and attributes to that. In NAD.

## 2021-03-08 NOTE — Discharge Instructions (Signed)
You have a small nodule under the skin which may be a lymph node, lipoma (small abnormal growth of fat cells) or a type of cyst.    You may take ibuprofen or tylenol for pain.  Follow up with your primary care doctor in the next few weeks if it persists, as you may need further outpatient workup such as an ultrasound or to see a dermatologist if it is still bothering you.  Return to the ER for new, worsening or persistent pain, swelling, redness, pain or swelling going up the arm, fever, or any other new or worsening symptoms that concern you.

## 2021-03-08 NOTE — ED Provider Notes (Signed)
[facility] Emergency Department Provider Note ____________________________________________   Event Date/Time   First MD Initiated Contact with Patient 03/08/21 1040     (approximate)  I have reviewed the triage vital signs and the nursing notes.   HISTORY  Chief Complaint Cyst    HPI Ashley Thornton is a 45 y.o. female who presents with a small lesion to her left forearm which has been present for the last 2 weeks.  She denies any preceding trauma or injury.  She states it is mildly painful.  She has not noted any redness or rash.  The pain does not radiate anywhere.  She has no fever chills or other acute symptoms.  She has no prior history of similar masses.  Past Medical History:  Diagnosis Date   Anxiety    Depression    Headache    Heart palpitations    History of ectopic pregnancy 2006   single fallopian tube remains   Ovarian cyst    right   Pelvic pain in female    Ruptured ear drum, left 2004   hearing returned   Tinnitus aurium, bilateral     Patient Active Problem List   Diagnosis Date Noted   Hydrosalpinx 08/30/2018   Neuropathy, cervical (radicular) 05/23/2017   DDD (degenerative disc disease), cervical 05/23/2017   Tobacco use 02/10/2014    Past Surgical History:  Procedure Laterality Date   ACDF N/A    cervical   BACK SURGERY  2005   L4-L5 slipped disc repair   KNEE SURGERY Right 2010   Broken knee, leg, ankle - rods and screws remain    Prior to Admission medications   Medication Sig Start Date End Date Taking? Authorizing Provider  acetaminophen (TYLENOL) 500 MG tablet Take 1,000 mg by mouth daily as needed.    [provider]  hydrOXYzine (ATARAX/VISTARIL) 25 MG tablet Take 0.5-1 tablets (12.5-25 mg total) by mouth 3 (three) times daily as needed for anxiety. 07/18/18   Karamalegos, Netta Neat, DO  valACYclovir (VALTREX) 1000 MG tablet valacyclovir 1 gram tablet  TAKE 1 TABLET BY MOUTH TWICE DAILY FOR 7  DAYS    [provider]    Allergies Codeine and Other  No family history on file.  Social History Social History   Tobacco Use   Smoking status: Every Day    Types: Cigarettes   Smokeless tobacco: Never   Tobacco comments:    Smokes 3 black and milds every day  Vaping Use   Vaping Use: Never used  Substance Use Topics   Alcohol use: Yes    Comment: 3x monthly, less than 4 drinks per occurrence   Drug use: Yes    Frequency: 3.0 times per week    Types: Marijuana    Comment: occasional    Review of Systems  Constitutional: No fever/chills Eyes: No visual changes. ENT: No sore throat. Cardiovascular: Denies chest pain. Respiratory: Denies shortness of breath. Gastrointestinal: No vomiting or diarrhea.  Genitourinary: Negative for dysuria.  Musculoskeletal: Negative for back pain. Skin: Negative for rash. Neurological: Negative for headaches, focal weakness or numbness.   ____________________________________________   PHYSICAL EXAM:  VITAL SIGNS: ED Triage Vitals [03/08/21 0919]  Enc Vitals Group     BP (!) 138/92     Pulse Rate 73     Resp 18     Temp 98 F (36.7 C)     Temp Source Oral     SpO2 99 %  Weight 185 lb (83.9 kg)     Height 5\' 11"  (1.803 m)     Head Circumference      Peak Flow      Pain Score 5     Pain Loc      Pain Edu?      Excl. in GC?     Constitutional: Alert and oriented. Well appearing and in no acute distress. Eyes: Conjunctivae are normal.  Head: Atraumatic. Nose: No congestion/rhinnorhea. Mouth/Throat: Mucous membranes are moist.   Neck: Normal range of motion.  Cardiovascular:  Good peripheral circulation.   Respiratory: Normal respiratory effort.  Gastrointestinal: No distention.  Musculoskeletal: Extremities warm and well perfused.  Neurologic:  Normal speech and language. No gross focal neurologic deficits are appreciated.  Skin:  Skin is warm and dry. No rash noted.  Left medial forearm with  approximately 1.5 cm superficial firm dermal lesion with no erythema or induration.  No fluctuance.  No abnormal warmth.  Nontender. Psychiatric: Mood and affect are normal. Speech and behavior are normal.  ____________________________________________   LABS (all labs ordered are listed, but only abnormal results are displayed)  Labs Reviewed - No data to display ____________________________________________  EKG   ____________________________________________  RADIOLOGY    ____________________________________________   PROCEDURES  Procedure(s) performed: No  Procedures  Critical Care performed: No ____________________________________________   INITIAL IMPRESSION / ASSESSMENT AND PLAN / ED COURSE  Pertinent labs & imaging results that were available during my care of the patient were reviewed by me and considered in my medical decision making (see chart for details).   45 year old female presents with an atraumatic and minimally painful small lesion to the left forearm.  On exam there is a small superficial lesion which is firm and nontender.  I performed a bedside ultrasound and identified a small, well demarcated solid-appearing mass at the dermis/subcutaneous fat.  Differential includes swollen lymph node, lipoma, versus other benign cyst or mass.  There is no evidence of superficial thrombophlebitis or DVT.  There is no evidence of abscess or cellulitis.  At this time, there is no indication for further ED work-up or treatment.  I counseled the patient on the results of my exam.  I recommended that she follow-up with her primary care doctor within the next few weeks and if the mass remains then she should have further outpatient work-up as needed.  Return precautions given, and she expresses understanding.  ____________________________________________   FINAL CLINICAL IMPRESSION(S) / ED DIAGNOSES  Final diagnoses:  Skin nodule      NEW MEDICATIONS STARTED  DURING THIS VISIT:  Discharge Medication List as of 03/08/2021 11:05 AM       Note:  This document was prepared using Dragon voice recognition software and may include unintentional dictation errors.    14/02/2021, MD 03/08/21 1228

## 2021-03-17 ENCOUNTER — Other Ambulatory Visit (HOSPITAL_COMMUNITY): Payer: Self-pay | Admitting: Nurse Practitioner

## 2021-03-17 DIAGNOSIS — Z1231 Encounter for screening mammogram for malignant neoplasm of breast: Secondary | ICD-10-CM

## 2021-04-14 ENCOUNTER — Other Ambulatory Visit: Payer: Self-pay

## 2021-04-14 ENCOUNTER — Ambulatory Visit (HOSPITAL_COMMUNITY)
Admission: RE | Admit: 2021-04-14 | Discharge: 2021-04-14 | Disposition: A | Discharge status: Home / Self Care | Payer: Self-pay | Source: Ambulatory Visit | Attending: Nurse Practitioner | Admitting: Nurse Practitioner

## 2021-04-14 DIAGNOSIS — Z1231 Encounter for screening mammogram for malignant neoplasm of breast: Secondary | ICD-10-CM | POA: Insufficient documentation

## 2021-04-23 ENCOUNTER — Telehealth: Payer: Self-pay

## 2021-04-23 NOTE — Telephone Encounter (Signed)
Telephoned patient at home number. Patient will back with income information and complete [facility] intake interview.

## 2021-04-26 ENCOUNTER — Other Ambulatory Visit (HOSPITAL_COMMUNITY): Payer: Self-pay | Admitting: Nurse Practitioner

## 2021-04-26 ENCOUNTER — Inpatient Hospital Stay
Admission: RE | Admit: 2021-04-26 | Discharge: 2021-04-26 | Disposition: A | Discharge status: Home / Self Care | Payer: Self-pay | Source: Ambulatory Visit | Attending: Nurse Practitioner | Admitting: Nurse Practitioner

## 2021-04-26 DIAGNOSIS — Z1231 Encounter for screening mammogram for malignant neoplasm of breast: Secondary | ICD-10-CM

## 2021-04-30 ENCOUNTER — Other Ambulatory Visit (HOSPITAL_COMMUNITY): Payer: Self-pay | Admitting: Obstetrics and Gynecology

## 2021-04-30 DIAGNOSIS — R928 Other abnormal and inconclusive findings on diagnostic imaging of breast: Secondary | ICD-10-CM

## 2021-06-04 ENCOUNTER — Inpatient Hospital Stay (HOSPITAL_COMMUNITY): Payer: Self-pay | Attending: Obstetrics and Gynecology

## 2021-06-08 ENCOUNTER — Ambulatory Visit (HOSPITAL_COMMUNITY): Payer: Self-pay

## 2021-06-08 ENCOUNTER — Encounter (HOSPITAL_COMMUNITY): Payer: Self-pay

## 2021-12-27 ENCOUNTER — Encounter: Payer: Self-pay | Admitting: Internal Medicine

## 2021-12-27 ENCOUNTER — Ambulatory Visit (INDEPENDENT_AMBULATORY_CARE_PROVIDER_SITE_OTHER): Payer: Self-pay | Admitting: Internal Medicine

## 2021-12-27 VITALS — BP 138/86 | HR 94 | Resp 18 | Ht 71.0 in | Wt 198.0 lb

## 2021-12-27 DIAGNOSIS — R102 Pelvic and perineal pain unspecified side: Secondary | ICD-10-CM | POA: Insufficient documentation

## 2021-12-27 DIAGNOSIS — M5030 Other cervical disc degeneration, unspecified cervical region: Secondary | ICD-10-CM

## 2021-12-27 DIAGNOSIS — Z720 Tobacco use: Secondary | ICD-10-CM

## 2021-12-27 DIAGNOSIS — Z2821 Immunization not carried out because of patient refusal: Secondary | ICD-10-CM

## 2021-12-27 DIAGNOSIS — G43909 Migraine, unspecified, not intractable, without status migrainosus: Secondary | ICD-10-CM | POA: Insufficient documentation

## 2021-12-27 DIAGNOSIS — Z13220 Encounter for screening for lipoid disorders: Secondary | ICD-10-CM

## 2021-12-27 DIAGNOSIS — Z7689 Persons encountering health services in other specified circumstances: Secondary | ICD-10-CM

## 2021-12-27 DIAGNOSIS — Z1321 Encounter for screening for nutritional disorder: Secondary | ICD-10-CM

## 2021-12-27 DIAGNOSIS — Z131 Encounter for screening for diabetes mellitus: Secondary | ICD-10-CM

## 2021-12-27 DIAGNOSIS — G43109 Migraine with aura, not intractable, without status migrainosus: Secondary | ICD-10-CM

## 2021-12-27 HISTORY — DX: Pelvic and perineal pain: R10.2

## 2021-12-27 MED ORDER — SUMATRIPTAN SUCCINATE 50 MG PO TABS: 50.0000 mg | ORAL_TABLET | ORAL | 0 refills | Status: DC | PRN

## 2021-12-27 NOTE — Assessment & Plan Note (Signed)
Her acute concern today was an increased frequency and migraine headaches.  They are now occurring 1-2 times monthly.  She endorses an aura prior to onset of headache.  Headaches are associated with nausea/vomiting, photophobia, and phonophobia.  They are described as throbbing in nature and inconsistent pattern.  She is currently using peppermint oil, Tylenol, and Excedrin for headache relief. -Sumatriptan 50 mg as needed prescribed today.  We will plan for follow-up in 6 weeks to see if she has had any additional migraines and if sumatriptan has been effective.  If the frequency or intensity of her headaches increase, consider starting a prophylactic agent.

## 2021-12-27 NOTE — Assessment & Plan Note (Signed)
Currently smoking 2-3 black and milds daily.  She states this is mostly when she gets home from work and is not busy.  She expresses an understanding of the need to quit, but does not have a strategy for how to quit.  -I reviewed the importance of smoking cessation and encouraged her to select a quit date.  We will further discuss this at follow-up in 6 weeks. -The patient was counseled on the dangers of tobacco use, and was advised to quit.  Reviewed strategies to maximize success, including removing cigarettes and smoking materials from environment, stress management, substitution of other forms of reinforcement, support of family/friends and written materials.

## 2021-12-27 NOTE — Progress Notes (Signed)
New Patient Office Visit  Subjective    Patient ID: Ashley Thornton, female    DOB: 07-03-1975  Age: 46 y.o. MRN: [id number]  CC:  Chief Complaint  Patient presents with   New Patient (Initial Visit)    New patient she has been having migraines had 2 sept and 1 aug they last all day when they come on    HPI Ashley Thornton presents to establish care.  She is a 46 year old woman with a past medical history significant for current tobacco use and degenerative disc disease of the cervical spine.  She has not had a primary care provider in many years.  Ashley Thornton acute concern today is migraine headaches.  She says she has previously gotten migraines about once every 6 months.  However, more recently she has been getting 1-2 migraines monthly.  She endorses associated nausea with vomiting, photophobia, and phonophobia.  She describes her headaches as a throbbing sensation that are not consistent pattern.  They are sometimes unilateral but can also be bilateral.  They often originate from the occipital region of her scalp, but are occasionally present in the frontal region as well.  She has been applying peppermint oil to her scalp for migraine relief and also taking Tylenol and Excedrin.  These medications have not been as effective recently as they previously were.  She is otherwise asymptomatic and without concerns.  Acute concerns, chronic medical conditions, and outstanding preventative healthcare maintenance items discussed today are individually addressed in A/P below.  Outpatient Encounter Medications as of 12/27/2021  Medication Sig   SUMAtriptan (IMITREX) 50 MG tablet Take 1 tablet (50 mg total) by mouth every 2 (two) hours as needed for migraine. May repeat in 2 hours if headache persists or recurs.   [DISCONTINUED] acetaminophen (TYLENOL) 500 MG tablet Take 1,000 mg by mouth daily as needed.   [DISCONTINUED] hydrOXYzine (ATARAX/VISTARIL) 25 MG tablet Take 0.5-1 tablets (12.5-25 mg  total) by mouth 3 (three) times daily as needed for anxiety.   [DISCONTINUED] valACYclovir (VALTREX) 1000 MG tablet valacyclovir 1 gram tablet  TAKE 1 TABLET BY MOUTH TWICE DAILY FOR 7 DAYS   No facility-administered encounter medications on file as of 12/27/2021.   Past Medical History:  Diagnosis Date   Anxiety    Depression    Headache    Heart palpitations    History of ectopic pregnancy 2006   single fallopian tube remains   History of fusion of cervical spine 04/02/2018   Hydrosalpinx 08/30/2018   Left hydrosalpinx on imaging 09/12/2017   Ovarian cyst    right   Pelvic pain in female    Pelvic pain in female 12/27/2021   Ruptured ear drum, left 2004   hearing returned   Tinnitus aurium, bilateral    Past Surgical History:  Procedure Laterality Date   ACDF N/A    cervical   BACK SURGERY  2005   L4-L5 slipped disc repair   KNEE SURGERY Right 2010   Broken knee, leg, ankle - rods and screws remain   History reviewed. No pertinent family history.  Social History   Socioeconomic History   Marital status: Single    Spouse name: Not on file   Number of children: Not on file   Years of education: Not on file   Highest education level: Not on file  Occupational History   Not on file  Tobacco Use   Smoking status: Every Day    Types: Cigarettes   Smokeless tobacco:  Never   Tobacco comments:    Smokes 3 black and milds every day  Vaping Use   Vaping Use: Never used  Substance and Sexual Activity   Alcohol use: Yes    Comment: 3x monthly, less than 4 drinks per occurrence   Drug use: Yes    Frequency: 3.0 times per week    Types: Marijuana    Comment: occasional   Sexual activity: Yes    Birth control/protection: Condom  Other Topics Concern   Not on file  Social History Narrative   Not on file   Social Determinants of Health   Financial Resource Strain: Not on file  Food Insecurity: Not on file  Transportation Needs: Not on file  Physical Activity: Not on  file  Stress: Not on file  Social Connections: Not on file  Intimate Partner Violence: Not on file   Review of Systems  Constitutional:  Negative for chills and fever.  HENT:  Negative for sore throat.   Respiratory:  Negative for cough and shortness of breath.   Cardiovascular:  Negative for chest pain, palpitations and leg swelling.  Gastrointestinal:  Negative for abdominal pain, blood in stool, constipation, diarrhea, nausea and vomiting.  Genitourinary:  Negative for dysuria and hematuria.  Musculoskeletal:  Negative for myalgias.  Skin:  Negative for itching and rash.  Neurological:  Positive for tingling (bilateral hands (chronic)). Negative for dizziness and headaches.  Psychiatric/Behavioral:  Negative for depression and suicidal ideas.    Objective    BP 138/86 (BP Location: Left Arm, Patient Position: Sitting, Cuff Size: Normal)   Pulse 94   Resp 18   Ht 5\' 11"  (1.803 m)   Wt 198 lb (89.8 kg)   SpO2 96%   BMI 27.62 kg/m   Physical Exam Vitals reviewed.  Constitutional:      General: She is not in acute distress.    Appearance: Normal appearance. She is normal weight.  HENT:     Head: Normocephalic and atraumatic.     Right Ear: External ear normal.     Left Ear: External ear normal.     Nose: Nose normal. No congestion or rhinorrhea.     Mouth/Throat:     Mouth: Mucous membranes are moist.     Pharynx: Oropharynx is clear. No oropharyngeal exudate or posterior oropharyngeal erythema.  Eyes:     General: No scleral icterus.    Conjunctiva/sclera: Conjunctivae normal.     Pupils: Pupils are equal, round, and reactive to light.  Cardiovascular:     Rate and Rhythm: Normal rate and regular rhythm.     Pulses: Normal pulses.     Heart sounds: Normal heart sounds. No murmur heard.    No friction rub. No gallop.  Pulmonary:     Effort: Pulmonary effort is normal.     Breath sounds: Normal breath sounds. No wheezing, rhonchi or rales.  Abdominal:     General:  Abdomen is flat. Bowel sounds are normal. There is no distension.     Palpations: Abdomen is soft.     Tenderness: There is no abdominal tenderness.  Musculoskeletal:        General: No swelling. Normal range of motion.     Cervical back: Normal range of motion.     Right lower leg: No edema.     Left lower leg: No edema.  Lymphadenopathy:     Cervical: No cervical adenopathy.  Skin:    General: Skin is warm and dry.  Capillary Refill: Capillary refill takes less than 2 seconds.  Neurological:     General: No focal deficit present.     Mental Status: She is alert and oriented to person, place, and time.  Psychiatric:        Mood and Affect: Mood normal.        Behavior: Behavior normal.    Assessment & Plan:   Problem List Items Addressed This Visit     Migraines - Primary    Her acute concern today was an increased frequency and migraine headaches.  They are now occurring 1-2 times monthly.  She endorses an aura prior to onset of headache.  Headaches are associated with nausea/vomiting, photophobia, and phonophobia.  They are described as throbbing in nature and inconsistent pattern.  She is currently using peppermint oil, Tylenol, and Excedrin for headache relief. -Sumatriptan 50 mg as needed prescribed today.  We will plan for follow-up in 6 weeks to see if she has had any additional migraines and if sumatriptan has been effective.  If the frequency or intensity of her headaches increase, consider starting a prophylactic agent.      Tobacco use    Currently smoking 2-3 black and milds daily.  She states this is mostly when she gets home from work and is not busy.  She expresses an understanding of the need to quit, but does not have a strategy for how to quit.  -I reviewed the importance of smoking cessation and encouraged her to select a quit date.  We will further discuss this at follow-up in 6 weeks. -The patient was counseled on the dangers of tobacco use, and was advised  to quit.  Reviewed strategies to maximize success, including removing cigarettes and smoking materials from environment, stress management, substitution of other forms of reinforcement, support of family/friends and written materials.      Encounter to establish care    Presenting today to establish care.  Outside records reviewed. -Baseline labs ordered today -She states that she will be receiving her influenza vaccine at work this week -Declines additional COVID-19 vaccines -She states that she underwent a Pap smear at the health department several months ago but requests a repeat Pap smear later this fall as she was not satisfied with the provider or explanation of results at the health department.  Records have been requested.      Return in about 6 weeks (around 02/07/2022).   Billie Lade, MD

## 2021-12-27 NOTE — Patient Instructions (Signed)
It was a pleasure to see you today.  Thank you for giving [place] the opportunity to be involved in your care.  Below is a brief recap of your visit and next steps.  We will plan to see you again in 6 weeks.  Summary We will check basic labs today I have prescribed sumatriptan today for migraine relief. We will request your previous medical records  Next steps Follow up in 6 weeks for pap smear and to see how things are going with your migraines I encourage you to pick a date to quit smoking

## 2021-12-27 NOTE — Assessment & Plan Note (Signed)
Presenting today to establish care.  Outside records reviewed. -Baseline labs ordered today -She states that she will be receiving her influenza vaccine at work this week -Declines additional COVID-19 vaccines -She states that she underwent a Pap smear at the health department several months ago but requests a repeat Pap smear later this fall as she was not satisfied with the provider or explanation of results at the health department.  Records have been requested.

## 2021-12-28 ENCOUNTER — Other Ambulatory Visit: Payer: Self-pay | Admitting: Internal Medicine

## 2021-12-28 DIAGNOSIS — E559 Vitamin D deficiency, unspecified: Secondary | ICD-10-CM

## 2021-12-28 LAB — CMP14+EGFR
ALT: 51 IU/L — ABNORMAL HIGH (ref 0–32)
AST: 43 IU/L — ABNORMAL HIGH (ref 0–40)
Albumin/Globulin Ratio: 1.6 (ref 1.2–2.2)
Albumin: 4.2 g/dL (ref 3.9–4.9)
Alkaline Phosphatase: 96 IU/L (ref 44–121)
BUN/Creatinine Ratio: 9 (ref 9–23)
BUN: 10 mg/dL (ref 6–24)
Bilirubin Total: <0.2 mg/dL (ref 0.0–1.2)
CO2: 25 mmol/L (ref 20–29)
Calcium: 9.6 mg/dL (ref 8.7–10.2)
Chloride: 103 mmol/L (ref 96–106)
Creatinine, Ser: 1.09 mg/dL — ABNORMAL HIGH (ref 0.57–1.00)
Globulin, Total: 2.7 g/dL (ref 1.5–4.5)
Glucose: 59 mg/dL — ABNORMAL LOW (ref 70–99)
Potassium: 5.2 mmol/L (ref 3.5–5.2)
Sodium: 141 mmol/L (ref 134–144)
Total Protein: 6.9 g/dL (ref 6.0–8.5)
eGFR: 64 mL/min/{1.73_m2} (ref >59)

## 2021-12-28 LAB — LIPID PANEL
Chol/HDL Ratio: 4.2 ratio (ref 0.0–4.4)
Cholesterol, Total: 227 mg/dL — ABNORMAL HIGH (ref 100–199)
HDL: 54 mg/dL (ref >39)
LDL Chol Calc (NIH): 128 mg/dL — ABNORMAL HIGH (ref 0–99)
Triglycerides: 258 mg/dL — ABNORMAL HIGH (ref 0–149)
VLDL Cholesterol Cal: 45 mg/dL — ABNORMAL HIGH (ref 5–40)

## 2021-12-28 LAB — CBC WITH DIFFERENTIAL/PLATELET
Basophils Absolute: 0 10*3/uL (ref 0.0–0.2)
Basos: 1 %
EOS (ABSOLUTE): 0.1 10*3/uL (ref 0.0–0.4)
Eos: 2 %
Hematocrit: 38.7 % (ref 34.0–46.6)
Hemoglobin: 12.8 g/dL (ref 11.1–15.9)
Immature Grans (Abs): 0 10*3/uL (ref 0.0–0.1)
Immature Granulocytes: 0 %
Lymphocytes Absolute: 2.2 10*3/uL (ref 0.7–3.1)
Lymphs: 41 %
MCH: 28.7 pg (ref 26.6–33.0)
MCHC: 33.1 g/dL (ref 31.5–35.7)
MCV: 87 fL (ref 79–97)
Monocytes Absolute: 0.4 10*3/uL (ref 0.1–0.9)
Monocytes: 7 %
Neutrophils Absolute: 2.7 10*3/uL (ref 1.4–7.0)
Neutrophils: 49 %
Platelets: 252 10*3/uL (ref 150–450)
RBC: 4.46 x10E6/uL (ref 3.77–5.28)
RDW: 12.2 % (ref 11.7–15.4)
WBC: 5.4 10*3/uL (ref 3.4–10.8)

## 2021-12-28 LAB — HEMOGLOBIN A1C
Est. average glucose Bld gHb Est-mCnc: 105 mg/dL
Hgb A1c MFr Bld: 5.3 % (ref 4.8–5.6)

## 2021-12-28 LAB — VITAMIN D 25 HYDROXY (VIT D DEFICIENCY, FRACTURES): Vit D, 25-Hydroxy: 17.4 ng/mL — ABNORMAL LOW (ref 30.0–100.0)

## 2021-12-28 MED ORDER — VITAMIN D (ERGOCALCIFEROL) 1.25 MG (50000 UNIT) PO CAPS: 50000.0000 [IU] | ORAL_CAPSULE | ORAL | 0 refills | Status: DC

## 2021-12-31 ENCOUNTER — Other Ambulatory Visit: Payer: Self-pay

## 2021-12-31 ENCOUNTER — Telehealth: Payer: Self-pay | Admitting: Internal Medicine

## 2021-12-31 DIAGNOSIS — E559 Vitamin D deficiency, unspecified: Secondary | ICD-10-CM

## 2021-12-31 MED ORDER — VITAMIN D (ERGOCALCIFEROL) 1.25 MG (50000 UNIT) PO CAPS: 50000.0000 [IU] | ORAL_CAPSULE | ORAL | 0 refills | Status: AC

## 2021-12-31 NOTE — Telephone Encounter (Signed)
Sent and returned call

## 2021-12-31 NOTE — Telephone Encounter (Signed)
Pt called stating her medication was sent to wrong pharmacy. Can you please resent to [place]?   Also please change phar in chart to [facility] [place]

## 2022-02-07 ENCOUNTER — Encounter: Payer: Self-pay | Admitting: Internal Medicine

## 2022-02-07 ENCOUNTER — Ambulatory Visit (INDEPENDENT_AMBULATORY_CARE_PROVIDER_SITE_OTHER): Payer: Self-pay | Admitting: Internal Medicine

## 2022-02-07 DIAGNOSIS — U071 COVID-19: Secondary | ICD-10-CM

## 2022-02-07 NOTE — Progress Notes (Signed)
   Acute Telephone Visit  Virtual Visit via Telephone Note  I connected with Ashley Thornton on 02/07/22 at  9:40 AM EST by telephone and verified that I am speaking with the correct person using two identifiers.  Location: Patient: [address], [place], [place] [address] Provider: [phone] [address], [place], [place] [address]   I discussed the limitations, risks, security and privacy concerns of performing an evaluation and management service by telephone and the availability of in person appointments. I also discussed with the patient that there may be a patient responsible charge related to this service. The patient expressed understanding and agreed to proceed.  Chief Complaint  Patient presents with   Covid Positive    Body aches, chills, sore throat and shakes last week. Was working with COVID pts Thursday and Friday before taking test. COVID test 10/8. Now mainly just congestion and burning below rib cage. Noticed ringing in ears prior to testing positive.    History of Present Illness:  Ms. Ashley Thornton is a 46 year old woman evaluated today via telephone encounter after testing positive for COVID-19 on Wednesday 11/8.  She reports onset of symptoms Tuesday 11/7, which began with right ear tinnitus and a sore throat.  This quickly transition to body aches, chills, and chest congestion.  She has been checking her temperature at home but has not had a fever.  She does not endorse a cough currently, but states that she has to clear her throat frequently.  Her sputum is thick but clear.  She also endorses reflux symptoms.  Ms. Ashley Thornton has been taking NyQuil, Zyrtec, applying Vicks VapoRub, and using Alka-Seltzer cold/flu for symptom relief.  Overall she believes that her symptoms are improving.  She works as a Therapist, art in various nursing homes and had 2 known COVID-19 exposures prior to the onset of her symptoms. Ms. Ashley Thornton is vaccinated against COVID-19.  Assessment and  Plan:  COVID-19 Evaluated today via telephone encounter after testing positive for COVID-19 on 11/8.  Overall she feels as though her symptoms are improving.  She currently endorses chest congestion, body aches, and reflux symptoms. -No indication for COVID-19 specific outpatient therapy.  I recommended continued supportive care measures for now including adequate hydration, Tylenol, and use of cough medication. -Given the nature of her job, I recommended that she not return to work until 10 days after the onset of her symptoms, which is 02/12/2022.  When she does return to work, I recommended that she wear a mask.  She expressed understanding. -We will schedule routine follow-up in 2-4 weeks  Follow Up Instructions:  I discussed the assessment and treatment plan with the patient. The patient was provided an opportunity to ask questions and all were answered. The patient agreed with the plan and demonstrated an understanding of the instructions.   The patient was advised to call back or seek an in-person evaluation if the symptoms worsen or if the condition fails to improve as anticipated.  I provided 16 minutes of non-face-to-face time during this encounter.   Billie Lade, MD

## 2022-02-09 ENCOUNTER — Telehealth: Payer: Self-pay | Admitting: Internal Medicine

## 2022-02-09 NOTE — Telephone Encounter (Signed)
Pt called stating she test +covid last week & is still show +. Wants to know when she needs to retest & if it can be done here?

## 2022-02-09 NOTE — Telephone Encounter (Signed)
Returned pt call  

## 2022-03-07 ENCOUNTER — Ambulatory Visit: Payer: Self-pay | Admitting: Internal Medicine

## 2022-03-15 ENCOUNTER — Ambulatory Visit: Payer: Self-pay | Admitting: Internal Medicine

## 2022-04-29 ENCOUNTER — Other Ambulatory Visit: Payer: Self-pay

## 2022-04-29 ENCOUNTER — Telehealth: Payer: Self-pay | Admitting: Internal Medicine

## 2022-04-29 ENCOUNTER — Encounter: Payer: Self-pay | Admitting: Internal Medicine

## 2022-04-29 DIAGNOSIS — N390 Urinary tract infection, site not specified: Secondary | ICD-10-CM

## 2022-04-29 DIAGNOSIS — R300 Dysuria: Secondary | ICD-10-CM

## 2022-04-29 MED ORDER — NITROFURANTOIN MONOHYD MACRO 100 MG PO CAPS: 100.0000 mg | ORAL_CAPSULE | Freq: Two times a day (BID) | ORAL | 0 refills | Status: AC

## 2022-04-29 NOTE — Telephone Encounter (Signed)
Patient called, stated she took an at home UTI test and it's +.  She would like something called in if possible.  [facility] [place].  Pt's # [phone]

## 2022-04-29 NOTE — Telephone Encounter (Signed)
Macrobid 100 mg BID x 5 days prescribed. Needs to provide urine sample for UA and culture.

## 2022-04-29 NOTE — Telephone Encounter (Signed)
Patient aware.

## 2022-05-02 ENCOUNTER — Telehealth: Payer: Self-pay | Admitting: Internal Medicine

## 2022-05-02 NOTE — Telephone Encounter (Signed)
Patient came by the office this morning to leave urine sample (teams message sent). Patient was told to come by Friday, 2/2 but was unable to get here before 5 pm. Patient left urine sample in the lab 02/05 at 8:12 am.

## 2022-05-03 LAB — UA/M W/RFLX CULTURE, ROUTINE
Bilirubin, UA: NEGATIVE
Glucose, UA: NEGATIVE
Ketones, UA: NEGATIVE
Leukocytes,UA: NEGATIVE
Nitrite, UA: NEGATIVE
RBC, UA: NEGATIVE
Specific Gravity, UA: 1.026 (ref 1.005–1.030)
Urobilinogen, Ur: 0.2 mg/dL (ref 0.2–1.0)
pH, UA: 6.5 (ref 5.0–7.5)

## 2022-05-03 LAB — MICROSCOPIC EXAMINATION
Bacteria, UA: NONE SEEN
Casts: NONE SEEN /lpf
WBC, UA: NONE SEEN /hpf (ref 0–5)

## 2022-05-04 ENCOUNTER — Encounter: Payer: Self-pay | Admitting: Internal Medicine

## 2022-06-17 ENCOUNTER — Ambulatory Visit: Payer: Self-pay | Admitting: Obstetrics & Gynecology

## 2022-07-04 DIAGNOSIS — Z139 Encounter for screening, unspecified: Secondary | ICD-10-CM

## 2022-07-04 LAB — GLUCOSE, POCT (MANUAL RESULT ENTRY): POC Glucose: 92 mg/dl (ref 70–99)

## 2022-07-04 NOTE — Addendum Note (Signed)
Addended by: Joyce Gross on: 07/04/2022 07:32 PM   Modules accepted: Orders

## 2022-07-04 NOTE — Congregational Nurse Program (Addendum)
Pt attended [facility]  to complete enrollment into the Care Connect Uninsured Program of [place] in addition to becoming re-established with a different PCP until her uninsured status improves to make her visits more affordable for her.  Pt was last established with a PCP most recently at [facility] as of 04/2022. States she recent attended a mobile dental clinic on 4.6.24 in which she had gotten her wisdom tooth pulled and is still having some slight achiness.   She states she is  also under some stress with some family related stress, in  addition to currently feeling very tired during her visit  on today due to just getting off of work this morning and not having any sleep/rest, which she feels all may be contributing to her ABNORMAL BP   Chief Reasons Needed for  PCP She is mainly wanting to follow up regarding her urinary condition that she feels may not be completely resolved from her last encounter with the primary care visit that she had in 04/2022 as she thought it was .  States she was prescribed antibiotics that seemed to work inititally, but states she feels as though her symptoms of burning sensation when urinating, in addition to some discharge seen even after completing the antibiotic treatment exists.  States she is not currently sexually active and was last active 6 months ago  Past /Current Medical History Reports anxiety and arthritis only   Vital Signs Checked (see additional readings in vitals section of chart) BP=was ABN 2 x times  (reports never diagnosed with HTN or ever having to take medication) Blood Glucose Checked= (Nonfasting)- WNL  (92) HR= ABN = 55  Socio-determinants health needs identified: -Food insecurity (referred to [employer]- pt chose not to shop during visit on today, but was provided information on hours and opportunity for shopping, when she decides to return)  PLAN -Reveiwed risk stratification and will  maintain at moderate due to PH-Q9 score of 10.  Checked in with patient and her level  is more related to her medical condition and attempting to figure out what is going on with her body   -Pt educated on BP prevention/management, signs and symptoms and importance of seeking medical treatment and to practice home self monitoring.  Pt was advised to check her BP daily and to log her BP and report to the new pcp on her scheduled visit on Thurs, 4.11.24 at 9am   Will provided continous nurse case management upon completion of followup of pt's first medical appointment  with [facility]  Pt completed on today with Care Connect Enrollment Specialist/Care Guide [place] No [facility].) -  Enrollment completed Care Connect Program.  - Care Connect Eligibile (07/04/2022 thru 07/04/2023)) - MedAssist Application was expedited and emailed     (pending approval) [facility] Financial Assistance application expedited as well on today and faxed (pending approval) -Medicaid Application completed and faxed on today (pending approval)  -Scheduled Appointment for first medical visit was scheduled for Thurs, 4.11.24 at 9:00am at the [facility] [place].

## 2022-07-07 ENCOUNTER — Other Ambulatory Visit: Payer: Self-pay | Admitting: Physician Assistant

## 2022-07-07 ENCOUNTER — Encounter: Payer: Self-pay | Admitting: Physician Assistant

## 2022-07-07 ENCOUNTER — Ambulatory Visit: Payer: Self-pay | Admitting: Physician Assistant

## 2022-07-07 VITALS — BP 130/82 | HR 76 | Temp 98.1°F | Wt 194.5 lb

## 2022-07-07 DIAGNOSIS — R7989 Other specified abnormal findings of blood chemistry: Secondary | ICD-10-CM

## 2022-07-07 DIAGNOSIS — Z1239 Encounter for other screening for malignant neoplasm of breast: Secondary | ICD-10-CM

## 2022-07-07 DIAGNOSIS — F419 Anxiety disorder, unspecified: Secondary | ICD-10-CM

## 2022-07-07 DIAGNOSIS — R928 Other abnormal and inconclusive findings on diagnostic imaging of breast: Secondary | ICD-10-CM

## 2022-07-07 DIAGNOSIS — E559 Vitamin D deficiency, unspecified: Secondary | ICD-10-CM

## 2022-07-07 DIAGNOSIS — Z1211 Encounter for screening for malignant neoplasm of colon: Secondary | ICD-10-CM

## 2022-07-07 DIAGNOSIS — Z720 Tobacco use: Secondary | ICD-10-CM

## 2022-07-07 DIAGNOSIS — E785 Hyperlipidemia, unspecified: Secondary | ICD-10-CM

## 2022-07-07 DIAGNOSIS — Z7689 Persons encountering health services in other specified circumstances: Secondary | ICD-10-CM

## 2022-07-07 MED ORDER — CITALOPRAM HYDROBROMIDE 20 MG PO TABS: 20.0000 mg | ORAL_TABLET | Freq: Every day | ORAL | 0 refills | Status: DC

## 2022-07-07 NOTE — Progress Notes (Signed)
BP 130/82   Pulse 76   Temp 98.1 F (36.7 C)   Wt 194 lb 8 oz (88.2 kg)   SpO2 95%   BMI 26.02 kg/m    Subjective:    Patient ID: Ashley Thornton, female    DOB: 1976/02/22, 47 y.o.   MRN: [id number]  HPI: Ashley Thornton is a 47 y.o. female presenting on 07/07/2022 for No chief complaint on file.   HPI  Pt is 47yoF who is in today to establish care.    She works as a Lawyer- travel type.   She saw dr Durwin Nora in October last year.  At that time, she had  low vit d, dyslipidemia, mildly elevated Cr at 1.09 and mildly elevated LFTs.  She was given some imitrex which she says she Hasn't needed in several months.  It works well when she uses it.  She reports some anxiety.    Depression is "so-so".  She has no SI, HI.   She says she gets a bit moody around time of her menses.    She doesn't exercise regularly  Her Last mammogram was January 2023.  It Was recommended to get [place] which didn't get done.  She has Never had colon sceening.    She had PAP at [facility] in 2022 or 2023.  She is Concerned-2 UTIs in past 6 months.  She doesn't have a new partner but is sexually active.   She says she hasn't had intercourse since November.   One UTI was treated at [facility] and the other with Dr Durwin Nora.   She had already been on antibiotics when she submitted her sample for Dr Durwin Nora; the UA was negative so no culture was done.  Records from [facility] are not available at this time.    Relevant past medical, surgical, family and social history reviewed and updated as indicated. Interim medical history since our last visit reviewed. Allergies and medications reviewed and updated.    Current Outpatient Medications:    SUMAtriptan (IMITREX) 50 MG tablet, Take 1 tablet (50 mg total) by mouth every 2 (two) hours as needed for migraine. May repeat in 2 hours if headache persists or recurs., Disp: 10 tablet, Rfl: 0   acetaminophen (TYLENOL) 500 MG tablet, Take 500 mg by mouth every 6 (six) hours as  needed for mild pain or moderate pain. (Patient not taking: Reported on 07/07/2022), Disp: , Rfl:    Review of Systems  Per HPI unless specifically indicated above     Objective:    BP 130/82   Pulse 76   Temp 98.1 F (36.7 C)   Wt 194 lb 8 oz (88.2 kg)   SpO2 95%   BMI 26.02 kg/m   Wt Readings from Last 3 Encounters:  07/07/22 194 lb 8 oz (88.2 kg)  07/04/22 194 lb 6.4 oz (88.2 kg)  12/27/21 198 lb (89.8 kg)    Physical Exam Vitals reviewed.  Constitutional:      General: She is not in acute distress.    Appearance: Normal appearance. She is well-developed. She is not ill-appearing or toxic-appearing.  HENT:     Head: Normocephalic and atraumatic.     Right Ear: Tympanic membrane, ear canal and external ear normal.     Left Ear: Tympanic membrane, ear canal and external ear normal.     Mouth/Throat:     Mouth: Mucous membranes are moist.     Pharynx: Oropharynx is clear.  Eyes:  Extraocular Movements: Extraocular movements intact.     Conjunctiva/sclera: Conjunctivae normal.     Pupils: Pupils are equal, round, and reactive to light.  Neck:     Thyroid: No thyroid mass, thyromegaly or thyroid tenderness.  Cardiovascular:     Rate and Rhythm: Normal rate and regular rhythm.     Pulses:          Radial pulses are 2+ on the right side and 2+ on the left side.  Pulmonary:     Effort: Pulmonary effort is normal.     Breath sounds: Normal breath sounds.  Abdominal:     General: Bowel sounds are normal.     Palpations: Abdomen is soft. There is no hepatomegaly, splenomegaly, mass or pulsatile mass.     Tenderness: There is no abdominal tenderness. There is no guarding or rebound.  Musculoskeletal:     Cervical back: Neck supple.     Right lower leg: No edema.     Left lower leg: No edema.  Lymphadenopathy:     Cervical: No cervical adenopathy.  Skin:    General: Skin is warm and dry.  Neurological:     Mental Status: She is alert and oriented to person, place,  and time.     Sensory: Sensation is intact.     Motor: No weakness or tremor.     Coordination: Romberg sign negative. Finger-Nose-Finger Test and Heel to [place] Test normal.     Gait: Gait is intact. Gait normal.     Deep Tendon Reflexes:     Reflex Scores:      Patellar reflexes are 2+ on the right side and 2+ on the left side. Psychiatric:        Attention and Perception: Attention normal.        Mood and Affect: Affect is not inappropriate.        Speech: Speech normal.        Behavior: Behavior normal. Behavior is cooperative.     Comments: Pleasant and engaged       PHQ 9 score - 5 GAD 7 score -   9     Assessment & Plan:   Encounter Diagnoses  Name Primary?   Encounter to establish care Yes   Dyslipidemia    Vitamin D deficiency    Elevated LFTs    Elevated serum creatinine    Anxiety    Encounter for screening for malignant neoplasm of breast, unspecified screening modality    Abnormal mammogram    Colon cancer screening    Tobacco use       HCM -pt was given FIT test for colon cancer screening -refer for mammogram.   -sent record request to [facility] for her last PAP  anxiety -scheduled with [facility] -start citalopram  Vit D deficiency -recheck Vitamin D level  Dyslipidemia -check fasting lipids  Elevated LFTs -recheck LFTs  Elevated Cr -recheck Creatinine level  Smoking -recommended cessation  Frequent UTIs -discussed with pt things to reduce risk of UTI (drinking water, avoiding sodas, voiding after intercourse, etc).  If she feels she has another infection, she is to contact office;  she will need culture..  she states understanding   Pt to follow up 2 weeks to check citalopram and review labs.  She is to contact office sooner prn

## 2022-07-12 ENCOUNTER — Telehealth: Payer: Self-pay

## 2022-07-12 ENCOUNTER — Encounter: Payer: Self-pay | Admitting: Physician Assistant

## 2022-07-12 NOTE — Telephone Encounter (Signed)
Telephoned patient at mobile number. Left a voice message with [facility] (scholarship) contact information. 

## 2022-07-13 ENCOUNTER — Other Ambulatory Visit (HOSPITAL_COMMUNITY)
Admission: RE | Admit: 2022-07-13 | Discharge: 2022-07-13 | Disposition: A | Discharge status: Home / Self Care | Payer: Self-pay | Source: Ambulatory Visit | Attending: Physician Assistant | Admitting: Physician Assistant

## 2022-07-13 DIAGNOSIS — E559 Vitamin D deficiency, unspecified: Secondary | ICD-10-CM | POA: Insufficient documentation

## 2022-07-13 DIAGNOSIS — E785 Hyperlipidemia, unspecified: Secondary | ICD-10-CM | POA: Insufficient documentation

## 2022-07-13 DIAGNOSIS — R7989 Other specified abnormal findings of blood chemistry: Secondary | ICD-10-CM | POA: Insufficient documentation

## 2022-07-13 LAB — LIPID PANEL
Cholesterol: 210 mg/dL — ABNORMAL HIGH (ref 0–200)
HDL: 69 mg/dL (ref >40)
LDL Cholesterol: 125 mg/dL — ABNORMAL HIGH (ref 0–99)
Total CHOL/HDL Ratio: 3 RATIO
Triglycerides: 80 mg/dL (ref <150)
VLDL: 16 mg/dL (ref 0–40)

## 2022-07-13 LAB — COMPREHENSIVE METABOLIC PANEL
ALT: 18 U/L (ref 0–44)
AST: 20 U/L (ref 15–41)
Albumin: 3.9 g/dL (ref 3.5–5.0)
Alkaline Phosphatase: 63 U/L (ref 38–126)
Anion gap: 8 (ref 5–15)
BUN: 15 mg/dL (ref 6–20)
CO2: 21 mmol/L — ABNORMAL LOW (ref 22–32)
Calcium: 9.1 mg/dL (ref 8.9–10.3)
Chloride: 105 mmol/L (ref 98–111)
Creatinine, Ser: 0.83 mg/dL (ref 0.44–1.00)
GFR, Estimated: >60 mL/min (ref >60)
Glucose, Bld: 102 mg/dL — ABNORMAL HIGH (ref 70–99)
Potassium: 3.9 mmol/L (ref 3.5–5.1)
Sodium: 134 mmol/L — ABNORMAL LOW (ref 135–145)
Total Bilirubin: 0.9 mg/dL (ref 0.3–1.2)
Total Protein: 7.7 g/dL (ref 6.5–8.1)

## 2022-07-14 LAB — POC FIT TEST STOOL: Fecal Occult Blood: NEGATIVE

## 2022-07-15 ENCOUNTER — Ambulatory Visit: Payer: Self-pay | Admitting: Obstetrics & Gynecology

## 2022-07-19 ENCOUNTER — Ambulatory Visit: Payer: Self-pay | Admitting: Physician Assistant

## 2022-07-19 ENCOUNTER — Ambulatory Visit: Payer: Self-pay

## 2022-07-19 ENCOUNTER — Encounter: Payer: Self-pay | Admitting: Physician Assistant

## 2022-07-19 VITALS — BP 122/75 | HR 72 | Temp 98.2°F | Ht 71.0 in | Wt 191.0 lb

## 2022-07-19 DIAGNOSIS — M7712 Lateral epicondylitis, left elbow: Secondary | ICD-10-CM

## 2022-07-19 DIAGNOSIS — G8929 Other chronic pain: Secondary | ICD-10-CM

## 2022-07-19 DIAGNOSIS — Z720 Tobacco use: Secondary | ICD-10-CM

## 2022-07-19 DIAGNOSIS — F419 Anxiety disorder, unspecified: Secondary | ICD-10-CM

## 2022-07-19 MED ORDER — CITALOPRAM HYDROBROMIDE 20 MG PO TABS: 20.0000 mg | ORAL_TABLET | Freq: Every day | ORAL | 0 refills | Status: DC

## 2022-07-19 NOTE — Progress Notes (Signed)
This is session # 1 with Ashley Thornton for individual behavioral health services for Anxiety. Patient presents alone. BHP spent 40 minutes with patient.    Patient presents with anxious symptoms including autonomic hyperactivity, difficulty breathing, racing thoughts, feelings of overwhelm, and hypervigilance. Symptoms are related to environmental and interpersonal stressors, and trauma hx. Patient will likely benefit from continued individual services with strengths-based intervention and coping skill development.   This procedure has been fully reviewed with the patient and informed consent has been obtained.   Patient location: Pt seen in clinic via telehealth.   I connected with Damesha on 07/19/22 by a video enabled telemedicine application and verified that I am speaking with the correct person.   I discussed the limitations of evaluation and management by telemedicine. The patient expressed understanding and agreed to proceed.     ASSESSMENT AND PLAN   Current diagnosis:  Anxiety   We created the following treatment plan at today's appointment:               1)  07/19/22               2)  Decrease anxious symptoms through increased coping skills, increase insight and processing, and increase overall life satisfaction and well-being through increased support and resources.                3)  No medication discussed at this time.                4)  Next screening due in 5 sessions.     HISTORY OF PRESENT ILLNESS   Mental Status: Ashley Thornton presented oriented X4. Mood and affect were anxious. Judgment was appropriate. Memory was appropriate. Thought processes and content were appropriate. Patient denies SI/HI.     Narrative: Met with pt for scheduled appointment. Informed consent obtained verbally. Person-centered approach used to begin therapeutic alliance and support. Pt describes presenting concern, hx, supports, and goals. Explored trauma hx and resilience. Psychoeducation provided through  the Window of Resilience to normalize fight/flight sx and note coping skills. Assessed for any other concerns pt wanted to address today, which they denied. Scheduled follow up in 2 weeks.   Effectiveness of the intervention(s) and the beneficiary's response or progress toward goal(s):  Patient is in a contemplative stage of change to engage in treatment plan.

## 2022-07-19 NOTE — Patient Instructions (Signed)
Tennis Elbow  Tennis elbow (lateral epicondylitis) is inflammation of tendons in your outer forearm, near your elbow. Tendons are tissues that connect muscle to bone. When you have tennis elbow, inflammation affects the tendons that you use to bend your wrist and move your hand up. Inflammation occurs in the lower part of the upper arm bone (humerus), where the tendons connect to the bone (lateral epicondyle). Tennis elbow often affects people who play tennis, but anyone may get the condition from repeatedly extending the wrist or turning the forearm. What are the causes? This condition is usually caused by repeatedly extending the wrist, turning the forearm, and using the hands. It can result from sports or work that requires repetitive forearm movements. In some cases, it may be caused by a sudden injury. What increases the risk? You are more likely to develop tennis elbow if you play tennis or another racket sport. You also have a higher risk if you frequently use your hands for work. Besides people who play tennis, others at greater risk include: People who use computers. Construction workers. People who work in factories. Musicians. Cooks. Cashiers. What are the signs or symptoms? Symptoms of this condition include: Pain and tenderness in the forearm and the outer part of the elbow. Pain may be felt only when using the arm, or it may be there all the time. A burning feeling that starts in the elbow and spreads down the forearm. A weak grip in the hand. How is this diagnosed? This condition is diagnosed based on your symptoms, your medical history, and a physical exam. You may also have X-rays or an MRI to: Confirm the diagnosis. Look for other issues. Check for tears in the ligaments, muscles, or tendons. How is this treated? Resting and icing your arm is often the first treatment. Your health care provider may also recommend: Medicines to reduce pain and inflammation. These may be in  the form of a pill, topical gels, or shots of a steroid medicine (cortisone). An elbow strap to reduce stress on the area. Physical therapy. This may include massage or exercises or both. An elbow brace to restrict the movements that cause symptoms. If these treatments do not help relieve your symptoms, your health care provider may recommend surgery to remove damaged muscle and reattach healthy muscle to bone. Follow these instructions at home: If you have a brace or strap: Wear the brace or strap as told by your health care provider. Remove it only as told by your health care provider. Check the skin around the brace or strap every day. Tell your health care provider about any concerns. Loosen the brace if your fingers tingle, become numb, or turn cold and blue. Keep the brace clean. If the brace or strap is not waterproof: Do not let it get wet. Cover it with a watertight covering when you take a bath or a shower. Managing pain, stiffness, and swelling  If directed, put ice on the injured area. To do this: If you have a removable brace or strap, remove it as told by your health care provider. Put ice in a plastic bag. Place a towel between your skin and the bag. Leave the ice on for 20 minutes, 2-3 times a day. Remove the ice if your skin turns bright red. This is very important. If you cannot feel pain, heat, or cold, you have a greater risk of damage to the area. Move your fingers often to reduce stiffness and swelling. Activity Rest your elbow   and wrist and avoid activities that cause symptoms as told by your health care provider. Do physical therapy exercises as told by your health care provider. If you lift an object, lift it with your palm facing up. This reduces stress on your elbow. Lifestyle If your tennis elbow is caused by sports, check your equipment and make sure that: You use it correctly. It is good match for you. If your tennis elbow is caused by work or computer  use, take frequent breaks to stretch your arm. Talk with your employer about ways to manage your condition at work. General instructions Take over-the-counter and prescription medicines only as told by your health care provider. Do not use any products that contain nicotine or tobacco. These products include cigarettes, chewing tobacco, and vaping devices, such as e-cigarettes. If you need help quitting, ask your health care provider. Keep all follow-up visits. This is important. How is this prevented? Before and after activity: Warm up and stretch before being active. Cool down and stretch after being active. Give your body time to rest between periods of activity. During activity: Make sure to use equipment that fits you. If you play tennis, put power in your stroke with your lower body. Avoid using your arm only. Maintain physical fitness, including: Strength. Flexibility. Endurance. Do exercises to strengthen the forearm muscles. Contact a health care provider if: You have pain that gets worse or does not get better with treatment. You have numbness or weakness in your forearm, hand, or fingers. Get help right away if: Your pain is severe. You cannot move your wrist. Summary Tennis elbow (lateral epicondylitis) is inflammation of tendons in your outer forearm, near your elbow. Common symptoms include pain and tenderness in your forearm and the outer part of your elbow. This condition is usually caused by repeatedly extending your wrist, turning your forearm, and using your hands. The first treatment is often resting and icing your arm to relieve symptoms. Further treatment may include taking medicine, getting physical therapy, wearing a brace or strap, or having surgery. This information is not intended to replace advice given to you by your health care provider. Make sure you discuss any questions you have with your health care provider. Document Revised: 09/24/2019 Document  Reviewed: 09/24/2019 Elsevier Patient Education  2023 Elsevier Inc.  

## 2022-07-19 NOTE — Progress Notes (Signed)
BP 122/75   Pulse 72   Temp 98.2 F (36.8 C)   Ht  (1.803 m)   SpO2 97%   BMI 27.13 kg/m    Subjective:    Patient ID: Ashley Thornton, female    DOB: [birth year], 47 y.o.   MRN: [id number]  HPI: Ashley Thornton is a 47 y.o. female presenting on 07/19/2022 for Follow-up   HPI   Pt is 47yoF who is in today to follow up on new pt appointment 2 weeks ago.    Pt C/o left knee pain for a couple of years.  No injury.  Says it aches and pains.  She says it limits how frequently she walks for exercise.   Pt Also pain in her left elbow.  Since January.  No injury.  She wears a sleeve at work at a [employer].  A knot popped up hand wrist but it went away; it was only there for a day.  Pt says she hasn't yet noticed any changes with the citalopram.  She has been taking it < 2 weeks now.  She is not having any side effects.    Relevant past medical, surgical, family and social history reviewed and updated as indicated. Interim medical history since our last visit reviewed. Allergies and medications reviewed and updated.   Current Outpatient Medications:    acetaminophen (TYLENOL) 500 MG tablet, Take 500 mg by mouth every 6 (six) hours as needed for mild pain or moderate pain., Disp: , Rfl:    citalopram (CELEXA) 20 MG tablet, Take 1 tablet (20 mg total) by mouth daily., Disp: 30 tablet, Rfl: 0   SUMAtriptan (IMITREX) 50 MG tablet, Take 1 tablet (50 mg total) by mouth every 2 (two) hours as needed for migraine. May repeat in 2 hours if headache persists or recurs., Disp: 10 tablet, Rfl: 0    Review of Systems  Per HPI unless specifically indicated above     Objective:    BP 122/75   Pulse 72   Temp 98.2 F (36.8 C)   Ht  (1.803 m)   SpO2 97%   BMI 27.13 kg/m   Wt Readings from Last 3 Encounters:  07/07/22 194 lb 8 oz (88.2 kg)  07/04/22 194 lb 6.4 oz (88.2 kg)  12/27/21 198 lb (89.8 kg)    Physical Exam Constitutional:      General: She is not in acute  distress.    Appearance: She is not toxic-appearing.  HENT:     Head: Normocephalic and atraumatic.  Cardiovascular:     Rate and Rhythm: Normal rate and regular rhythm.  Pulmonary:     Effort: Pulmonary effort is normal. No respiratory distress.     Breath sounds: Normal breath sounds. No stridor. No wheezing or rhonchi.  Musculoskeletal:     Left upper arm: Normal.     Left elbow: No swelling or deformity. Tenderness present in lateral epicondyle. No radial head, medial epicondyle or olecranon process tenderness.     Left forearm: Normal.     Left wrist: Normal.     Left knee: Crepitus present. No swelling, deformity or bony tenderness. Normal range of motion. No tenderness.     Right lower leg: No edema.     Left lower leg: No edema.  Skin:    General: Skin is warm and dry.  Neurological:     Mental Status: She is alert and oriented to person, place, and time.  Psychiatric:  Attention and Perception: Attention normal.        Speech: Speech normal.        Behavior: Behavior normal. Behavior is cooperative.       Results for orders placed or performed during the hospital encounter of 07/13/22  Comprehensive metabolic panel  Result Value Ref Range   Sodium 134 (L) 135 - 145 mmol/L   Potassium 3.9 3.5 - 5.1 mmol/L   Chloride 105 98 - 111 mmol/L   CO2 21 (L) 22 - 32 mmol/L   Glucose, Bld 102 (H) 70 - 99 mg/dL   BUN 15 6 - 20 mg/dL   Creatinine, Ser 5.36 0.44 - 1.00 mg/dL   Calcium 9.1 8.9 - 64.4 mg/dL   Total Protein 7.7 6.5 - 8.1 g/dL   Albumin 3.9 3.5 - 5.0 g/dL   AST 20 15 - 41 U/L   ALT 18 0 - 44 U/L   Alkaline Phosphatase 63 38 - 126 U/L   Total Bilirubin 0.9 0.3 - 1.2 mg/dL   GFR, Estimated >03 >47 mL/min   Anion gap 8 5 - 15  Lipid panel  Result Value Ref Range   Cholesterol 210 (H) 0 - 200 mg/dL   Triglycerides 80 <425 mg/dL   HDL 69 >95 mg/dL   Total CHOL/HDL Ratio 3.0 RATIO   VLDL 16 0 - 40 mg/dL   LDL Cholesterol 638 (H) 0 - 99 mg/dL       Assessment & Plan:    Encounter Diagnoses  Name Primary?   Anxiety Yes   Tobacco use    Chronic pain of left knee    Lateral epicondylitis of left elbow      HCM: -Reviewed labs with pt.  Vit d level pending -pt was encouraged to Call [facility] back to schedule her screening mammogram she says she has the phone number  Knee pain -Xray left knee -pt was given [facility]/application for [employer] financial assistance   dyslipidemia -encouraged Lowfat diet for mildly elevated LDL  Tennis elbow -pt was encouraged to get elbow strap, ice the elbow TID for 10-20 minutes, use nsaids prn.  She was given reading information on this  Anxiety -continue citalopram.   -she has appt with [facility] today  F/u 3 wk.  She is to contact office sooner prn

## 2022-07-21 LAB — VITAMIN D 1,25 DIHYDROXY
Vitamin D 1, 25 (OH)2 Total: 66 pg/mL — ABNORMAL HIGH
Vitamin D2 1, 25 (OH)2: 37 pg/mL
Vitamin D3 1, 25 (OH)2: 29 pg/mL

## 2022-07-22 ENCOUNTER — Encounter: Payer: Self-pay | Admitting: Physician Assistant

## 2022-08-09 ENCOUNTER — Ambulatory Visit: Payer: Self-pay | Admitting: Physician Assistant

## 2022-08-09 ENCOUNTER — Ambulatory Visit: Payer: Self-pay

## 2022-12-13 ENCOUNTER — Ambulatory Visit: Payer: Self-pay | Admitting: Physician Assistant

## 2022-12-13 ENCOUNTER — Encounter: Payer: Self-pay | Admitting: Physician Assistant

## 2022-12-13 ENCOUNTER — Other Ambulatory Visit: Payer: Self-pay

## 2022-12-13 VITALS — BP 118/78 | HR 76 | Temp 97.7°F | Wt 197.0 lb

## 2022-12-13 DIAGNOSIS — R1030 Lower abdominal pain, unspecified: Secondary | ICD-10-CM

## 2022-12-13 DIAGNOSIS — F419 Anxiety disorder, unspecified: Secondary | ICD-10-CM

## 2022-12-13 DIAGNOSIS — N926 Irregular menstruation, unspecified: Secondary | ICD-10-CM

## 2022-12-13 DIAGNOSIS — Z1239 Encounter for other screening for malignant neoplasm of breast: Secondary | ICD-10-CM

## 2022-12-13 DIAGNOSIS — M654 Radial styloid tenosynovitis [de Quervain]: Secondary | ICD-10-CM

## 2022-12-13 DIAGNOSIS — R002 Palpitations: Secondary | ICD-10-CM

## 2022-12-13 DIAGNOSIS — E559 Vitamin D deficiency, unspecified: Secondary | ICD-10-CM

## 2022-12-13 DIAGNOSIS — R928 Other abnormal and inconclusive findings on diagnostic imaging of breast: Secondary | ICD-10-CM

## 2022-12-13 DIAGNOSIS — N7011 Chronic salpingitis: Secondary | ICD-10-CM

## 2022-12-13 DIAGNOSIS — Z720 Tobacco use: Secondary | ICD-10-CM

## 2022-12-13 DIAGNOSIS — R102 Pelvic and perineal pain: Secondary | ICD-10-CM

## 2022-12-13 MED ORDER — PAROXETINE HCL 20 MG PO TABS
20.0000 mg | ORAL_TABLET | Freq: Every day | ORAL | 0 refills | Status: DC
Start: 2022-12-13 — End: 2023-01-03

## 2022-12-13 MED ORDER — MELOXICAM 15 MG PO TABS
15.0000 mg | ORAL_TABLET | Freq: Every day | ORAL | 1 refills | Status: DC
Start: 2022-12-13 — End: 2023-01-24

## 2022-12-13 NOTE — Progress Notes (Signed)
BP 118/78   Pulse 76   Temp 97.7 F (36.5 C)   Wt 197 lb (89.4 kg)   SpO2 99%   BMI 27.48 kg/m    Subjective:    Patient ID: Ashley Thornton, female    DOB: 1976-02-11, 47 y.o.   MRN: [id number]  HPI: Ashley Thornton is a 47 y.o. female presenting on 12/13/2022 for No chief complaint on file.   HPI   Pt last seen in April.  She says she misunderstood with insurance/medicaid/$$$$ which is why she didn't come in for follow up in May.  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  She says she Didn't notice improvement with about 2 months of citalopram in anxiety but did help her sleep.   She is sleeping some but still working 3rd shift and having school in the daytime.   She says she had pain after intercourse in August.  She used condom.  She says it was lower abd pain afterwords.  She Wonders if she has ovarian cyst.   She says she has had cycle q 2 wk- for entire month of August  Review of epic records shows that pt Had this same thing in 2019.   US showed probably hydrosalpinx.  She saw gyn 2020- for pelvic pain and hydrosalpinx-  She Went to different gyn for pelvic pain in 2020 and was treated for cervical polyps which were removed and given abx for BV.  It doesn't appear that she was seen in office again after procedure until 2021.   At 2021 appt- another cervical polpy removed.   They discussed possible surgery for hydrosalpinx.     Pt says her Tennis elbow is improved.  She says her right Wrist hurts.  it hurts more if she trys to extend thumb.  Her thumb ROM is decreased.  She has had No recent injury.  She is right hand dominant.   Pt says she has had Palpitations for years.  she has been to doctors and had ekgs that were always normal.   She says the episodes Never lasts more than couple of seconds but it Happens a few times thoughout the day.   She says she had heart monitor that didn't  show anything.   She Drinks mushroom coffee with no caffeine and she drinks dr pepper zero- has 3/day cans.   Epic review shows that pt saw cardiology in  2015 and had a holter and an exercise stress test which were all normal.     Relevant past medical, surgical, family and social history reviewed and updated as indicated. Interim medical history since our last visit reviewed. Allergies and medications reviewed and updated.   CURRENT MEDS: IBU prn    Review of Systems  Per HPI unless specifically indicated above     Objective:    BP 118/78   Pulse 76   Temp 97.7 F (36.5 C)   Wt 197 lb (89.4 kg)   SpO2 99%   BMI 27.48 kg/m   Wt Readings from Last 3 Encounters:  12/13/22 197 lb (89.4 kg)  07/19/22 191 lb (86.6 kg)  07/07/22 194 lb 8 oz (88.2 kg)    Physical Exam Vitals reviewed.  Constitutional:      General: She is not in acute distress.    Appearance: She is well-developed. She is not ill-appearing or toxic-appearing.  HENT:  Head: Normocephalic and atraumatic.  Cardiovascular:     Rate and Rhythm: Normal rate and regular rhythm.     Heart sounds: Normal heart sounds.  Pulmonary:     Effort: Pulmonary effort is normal.     Breath sounds: Normal breath sounds.  Abdominal:     General: Bowel sounds are normal.     Palpations: Abdomen is soft. There is no hepatomegaly, splenomegaly, mass or pulsatile mass.     Tenderness: There is no abdominal tenderness.  Musculoskeletal:     Right wrist: Tenderness present. No swelling or deformity. Decreased range of motion.     Right hand: Tenderness present. No deformity. Decreased range of motion.     Left hand: Normal.     Cervical back: Neck supple.     Right lower leg: No edema.     Left lower leg: No edema.     Comments: Right thumb ROM limited.  Right wrist flex/ext limited.  No redness swelling or heat. No deformity.  Most tender area is wrist area radial aspect.  Thumb only mildly tender and no point tenderness.     Lymphadenopathy:     Cervical: No cervical adenopathy.  Skin:    General: Skin is warm and dry.  Neurological:     Mental Status: She is alert and oriented to person, place, and time.  Psychiatric:        Attention and Perception: Attention normal.        Speech: Speech normal.        Behavior: Behavior normal. Behavior is cooperative.      EKG- NSR at 60bpm with no st-t changes     Assessment & Plan:    Encounter Diagnoses  Name Primary?   Palpitations Yes   Anxiety    Tobacco use    Abnormal mammogram    Encounter for screening for malignant neoplasm of breast, unspecified screening modality    Vitamin D deficiency    De Quervain's tenosynovitis    Pelvic pain    Lower abdominal pain    Abnormal menses    Hydrosalpinx       Vit d deficiency -Recommended OTC vit d  mood -Pt declined further appointments with [facility] -she would like to try something else.  Rx paxil  tendonitis -she is to get splint- thumb spica- to rest the thumb -recommended icing the area but she says ice makes it hurt more -had sample voltaren gel but she says she has that and it doesn't help so rx mobic 15mg  to take 1 daily -she may Need orthopedics  Abdominal pain/pelvic pain/hydrosalpinx/irreg menses -pt Needs updated [place] and referral to gyn for hydrosalpinx   Palpitations -pt is to Discontinue caffeine/sodas -discussed Low dose beta blocker but she declines at this time  Abnormal mammogram -pt is given contact number for [facility] to get scheduled for diagnostic mammogram  Uninsured -pt to work with care connect to apply for [facility].  She doesn't have an active bill now so can't apply for cafa and she prefers to get approved before getting any more bills.  Thus will wait to refer until she gets assistance   -pt to follow up 3 weeks.  She it to contact office sooner if she gets assistance approval or if needed for worsening or new symptoms

## 2022-12-13 NOTE — Patient Instructions (Addendum)
Please call [facility] to schedule your mammogram- [phone]

## 2022-12-15 ENCOUNTER — Other Ambulatory Visit: Payer: Self-pay | Admitting: Obstetrics and Gynecology

## 2022-12-15 DIAGNOSIS — R928 Other abnormal and inconclusive findings on diagnostic imaging of breast: Secondary | ICD-10-CM

## 2022-12-23 ENCOUNTER — Inpatient Hospital Stay: Payer: Self-pay

## 2022-12-26 ENCOUNTER — Telehealth: Payer: Self-pay

## 2022-12-26 NOTE — Telephone Encounter (Signed)
Patient called [facility] to complete screening and schedule appointment with [facility] program. Patient needs same day [facility] and imaging appointment at [facility] location. I explained to patient this location does not provide same day service for both appointments but I could schedule same day appointments at the [facility] location.

## 2023-01-03 ENCOUNTER — Encounter: Payer: Self-pay | Admitting: Physician Assistant

## 2023-01-03 ENCOUNTER — Telehealth: Payer: Self-pay | Admitting: Physician Assistant

## 2023-01-03 ENCOUNTER — Ambulatory Visit: Payer: Self-pay | Admitting: Physician Assistant

## 2023-01-03 VITALS — BP 126/81 | HR 80 | Temp 97.5°F

## 2023-01-03 DIAGNOSIS — R002 Palpitations: Secondary | ICD-10-CM

## 2023-01-03 DIAGNOSIS — R928 Other abnormal and inconclusive findings on diagnostic imaging of breast: Secondary | ICD-10-CM

## 2023-01-03 DIAGNOSIS — E559 Vitamin D deficiency, unspecified: Secondary | ICD-10-CM

## 2023-01-03 DIAGNOSIS — N926 Irregular menstruation, unspecified: Secondary | ICD-10-CM

## 2023-01-03 DIAGNOSIS — M654 Radial styloid tenosynovitis [de Quervain]: Secondary | ICD-10-CM

## 2023-01-03 DIAGNOSIS — N7011 Chronic salpingitis: Secondary | ICD-10-CM

## 2023-01-03 DIAGNOSIS — Z720 Tobacco use: Secondary | ICD-10-CM

## 2023-01-03 DIAGNOSIS — F419 Anxiety disorder, unspecified: Secondary | ICD-10-CM

## 2023-01-03 DIAGNOSIS — R1030 Lower abdominal pain, unspecified: Secondary | ICD-10-CM

## 2023-01-03 MED ORDER — PAROXETINE HCL 20 MG PO TABS: 20.0000 mg | ORAL_TABLET | Freq: Every day | ORAL | 0 refills | Status: DC

## 2023-01-03 NOTE — Progress Notes (Signed)
BP 126/81   Pulse 80   Temp (!) 97.5 F (36.4 C)   SpO2 97%    Subjective:    Patient ID: Ashley Thornton, female    DOB: 1975-04-01, 47 y.o.   MRN: [id number]  HPI: SHAMIA UPPAL is a 47 y.o. female presenting on 01/03/2023 for Anxiety, thumb recheck, and Abdominal Pain   HPI  Pt is in today for follow up from 12/13/22 appointment.  She needed some referrals at that time but didn't want to accrue more bills so preferred to wait until assistance in place.   Continuing from issues at previous OV-   Vit d deficiency -it was Recommended she start OTC vit d.  She got some and is plannng to start it soon.    Mood/anxiety/stress -Pt was started on paxil at previous OV.  She says she has No changes with paxil yet.  She is Sleeping okay.  She has been on the paxil less than 3 weeks   Tendonitis/thumb pain It is "horrible" -she got  splint- She got splint- wears it often, esp at work Icing only twic because icing causes sig pain It is No better  -recommended icing the area but she says ice makes it hurt more -had sample voltaren gel but she says she has that and it doesn't help so rx mobic 15mg  to take 1 daily -she may Need orthopedics Thumb is horrible-    Abdominal pain/pelvic pain/hydrosalpinx/irreg menses -pt Needs updated [place] and referral to gyn for hydrosalpinx  -Not too much pain since lasts OV but is getting cycle after 2 weeks    Palpitations -Feeling it less with stopping the caffeine but had to due a little bit of soda due to HA   Abnormal mammogram -pt didn't get scheduled due to scheduling limitations both on bcccp side and with her busy work and school schedule  Her current needs: Mon-done at 51 Tu/th- done at 12;15- Wed no clases Fri- too busy She prefers to do bot appointments same day     Uninsured -pt approved for [facility].     Relevant past medical, surgical, family and social history reviewed and updated as indicated. Interim medical history  since our last visit reviewed. Allergies and medications reviewed and updated.  Review of Systems  Per HPI unless specifically indicated above     Objective:    BP 126/81   Pulse 80   Temp (!) 97.5 F (36.4 C)   SpO2 97%   Wt Readings from Last 3 Encounters:  12/13/22 197 lb (89.4 kg)  07/19/22 191 lb (86.6 kg)  07/07/22 194 lb 8 oz (88.2 kg)    Physical Exam Vitals reviewed.  Constitutional:      General: She is not in acute distress.    Appearance: She is well-developed. She is not toxic-appearing.  HENT:     Head: Normocephalic and atraumatic.  Cardiovascular:     Rate and Rhythm: Normal rate and regular rhythm.  Pulmonary:     Effort: Pulmonary effort is normal.     Breath sounds: Normal breath sounds.  Abdominal:     General: Bowel sounds are normal.     Palpations: Abdomen is soft. There is no mass.     Tenderness: There is no abdominal tenderness.  Musculoskeletal:     Right wrist: Tenderness present. No swelling or bony tenderness. Normal range of motion.     Right hand: Tenderness present.     Cervical back: Neck supple.  Comments: Tenderness thumb  Lymphadenopathy:     Cervical: No cervical adenopathy.  Skin:    General: Skin is warm and dry.  Neurological:     Mental Status: She is alert and oriented to person, place, and time.  Psychiatric:        Behavior: Behavior normal.           Assessment & Plan:   Encounter Diagnoses  Name Primary?   De Quervain's tenosynovitis Yes   Hydrosalpinx    Irregular menses    Lower abdominal pain    Anxiety    Palpitations    Tobacco use    Abnormal mammogram    Vitamin D deficiency      Mood/anxiety/stress -Continue paxil.  Will plan to give it full 6 weeks    Thumb pain -Pt should continue with splinting -Refer to orthopedics  Hydrosalpinx/irreg menses -pt will need referral to gyn.  Will defer [place] to them  Abnormal mammogram -after pt left office, was able to connect with [facility]cp.  Appt  was made for December 5- date out due to it was first available to do both bcccp screening (which are only done in [place] on Thursdays) and diagnostic mammogram and [place] which pt needs since her last mammogram was abnormal.

## 2023-01-03 NOTE — Telephone Encounter (Signed)
Pt was called and notified of [facility] screening appt and diagnostic mammogram/US appts on Thursday December 5.   b[facility]cp- 2:00pm-  [address], [facility]- 2nd floor [facility]- 3:20pm- mammo/US- [address]  Explained to pt that bcccp screenings are only done in [place] on Thursdays and to have enough time for both diagnostic mammogram and [place] on Thursday, soonest date is December 5.    Pt is to contact [facility] if she needs to adjust appointments (NOT call b[facility]cp).  Pt states understanding.

## 2023-01-06 ENCOUNTER — Encounter: Payer: Self-pay | Admitting: Physician Assistant

## 2023-01-24 ENCOUNTER — Encounter: Payer: Self-pay | Admitting: Physician Assistant

## 2023-01-24 ENCOUNTER — Other Ambulatory Visit (HOSPITAL_COMMUNITY): Payer: Self-pay

## 2023-01-24 ENCOUNTER — Ambulatory Visit: Payer: Self-pay | Admitting: Physician Assistant

## 2023-01-24 ENCOUNTER — Encounter (HOSPITAL_COMMUNITY): Payer: Self-pay

## 2023-01-24 VITALS — BP 120/72 | HR 75 | Temp 97.3°F | Ht 71.0 in | Wt 197.5 lb

## 2023-01-24 DIAGNOSIS — F419 Anxiety disorder, unspecified: Secondary | ICD-10-CM

## 2023-01-24 DIAGNOSIS — M542 Cervicalgia: Secondary | ICD-10-CM

## 2023-01-24 MED ORDER — CYCLOBENZAPRINE HCL 10 MG PO TABS: 10.0000 mg | ORAL_TABLET | Freq: Two times a day (BID) | ORAL | 0 refills | Status: DC | PRN

## 2023-01-24 MED ORDER — ESCITALOPRAM OXALATE 10 MG PO TABS
10.0000 mg | ORAL_TABLET | Freq: Every day | ORAL | 1 refills | Status: DC
Start: 2023-01-24 — End: 2023-02-15

## 2023-01-24 NOTE — Patient Instructions (Addendum)
Nov 6- gynecologist  December 5-  2:00pm- b[facility]cp-  [address], [facility]- 2nd floor 3:20pm- mammogram/US- [address]   Use caution with nsaids- do not take take with the escitalopram

## 2023-01-24 NOTE — Progress Notes (Signed)
BP 120/72   Pulse 75   Temp (!) 97.3 F (36.3 C)   Ht 5\' 11"  (1.803 m)   Wt 197 lb 8 oz (89.6 kg)   SpO2 99%   BMI 27.55 kg/m    Subjective:    Patient ID: Ashley Thornton, female    DOB: Jun 19, 1975, 47 y.o.   MRN: [id number]  HPI: Ashley Thornton is a 47 y.o. female presenting on 01/24/2023 for Follow-up   HPI   Pt is 47yoF who is in today for recheck mood.  She isn't taking the paxil now because it gave her headache.  She was on citalopram earlier this year and she says it helped her sleep but she is unsure if it helped her anxiety.  She does not report any SI, HI.   She says she is really frustrated with her Aches & pains.  She is s/p neck surgery in 2019.  She had discectomy at surgical center in [place].    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 She says the Injection for her wrist is great  She says her palpitations are improved.    Relevant past medical, surgical, family and social history reviewed and updated as indicated. Interim medical history since our last visit reviewed. Allergies and medications reviewed and updated.   Current Outpatient Medications:    ibuprofen (ADVIL) 200 MG tablet, Take 200 mg by mouth every 6 (six) hours as needed., Disp: , Rfl:    acetaminophen (TYLENOL) 500 MG tablet, Take 500 mg by mouth every 6 (six) hours as needed for mild pain or moderate pain. (Patient not taking: Reported on 01/24/2023), Disp: , Rfl:    meloxicam (MOBIC) 15 MG tablet, Take 1 tablet (15 mg total) by mouth daily. (Patient not taking: Reported on 01/24/2023), Disp: 30 tablet, Rfl: 1   PARoxetine (PAXIL) 20 MG tablet, Take 1 tablet (20 mg total) by mouth daily. (Patient not taking: Reported on 01/24/2023), Disp: 30 tablet, Rfl: 0   SUMAtriptan (IMITREX) 50 MG tablet, Take 1 tablet (50 mg total) by mouth every 2 (two) hours as needed for migraine. May repeat in 2 hours if headache persists or recurs.  (Patient not taking: Reported on 12/13/2022), Disp: 10 tablet, Rfl: 0    Review of Systems  Per HPI unless specifically indicated above     Objective:    BP 120/72   Pulse 75   Temp (!) 97.3 F (36.3 C)   Ht 5\' 11"  (1.803 m)   Wt 197 lb 8 oz (89.6 kg)   SpO2 99%   BMI 27.55 kg/m   Wt Readings from Last 3 Encounters:  01/24/23 197 lb 8 oz (89.6 kg)  12/13/22 197 lb (89.4 kg)  07/19/22 191 lb (86.6 kg)    Physical Exam Constitutional:      General: She is not in acute distress.    Appearance: Normal appearance. She is not toxic-appearing.  HENT:     Head: Normocephalic and atraumatic.  Neck:     Comments: Some tenderness right trapezius.  No bony tenderness.  Decreased ROM all directions. No deformity.  Cardiovascular:     Rate and Rhythm: Normal rate and regular rhythm.  Pulmonary:     Effort: Pulmonary effort is normal. No respiratory distress.     Breath sounds: Normal breath sounds. No wheezing.  Musculoskeletal:     Right shoulder: Tenderness present. No swelling.  Left shoulder: No tenderness.     Cervical back: Neck supple. No edema or erythema. Muscular tenderness present. Decreased range of motion.  Skin:    General: Skin is warm and dry.  Neurological:     Mental Status: She is alert.         Assessment & Plan:   Encounter Diagnoses  Name Primary?   Anxiety Yes   Neck pain      anxiety - trial of escitalopram.  Discussed with pt that nsaid use with this medication can be an issue.  She says she is not using the mobic and the last time she took international ibu was Thursday or Friday last week.  She says she doesn't use nsaids often.  Aches and pains -rx flexeril for the neck.  She is cautioned to avoid driving on rx as it may cause sedation -heat/ice -discussed option of returning to orthopedics for evaluation to determine if Physical Therapy or other modality might be an option for her.  She says she will think on ortho for the  neck.  -she has appt November 6 with gyn -she has appt December 5 for mammogram -Pt to follow up here in 1 month.  She is to contact office sooner

## 2023-02-09 ENCOUNTER — Telehealth: Payer: Self-pay | Admitting: Physician Assistant

## 2023-02-09 NOTE — Telephone Encounter (Signed)
Pt called reporting BRBPR last night after having BM.  Pt reports history hemorrhoids but says blood last night more than previous.  Pt offered appt today but declined.  She was offered appt Monday but declined.  Appt is scheduled for Tuesday morning at 9am.  Pt recommended to use OTC stool softener and drink plenty water.  Also recommended to use OTC preparation H, particularly after moving bowels.  Pt states understanding.

## 2023-02-14 ENCOUNTER — Ambulatory Visit: Payer: Self-pay | Admitting: Physician Assistant

## 2023-02-15 ENCOUNTER — Ambulatory Visit: Payer: Self-pay | Admitting: Physician Assistant

## 2023-02-15 ENCOUNTER — Encounter: Payer: Self-pay | Admitting: Physician Assistant

## 2023-02-15 VITALS — BP 116/76 | HR 94 | Temp 97.6°F | Wt 195.5 lb

## 2023-02-15 DIAGNOSIS — K625 Hemorrhage of anus and rectum: Secondary | ICD-10-CM

## 2023-02-15 DIAGNOSIS — G43109 Migraine with aura, not intractable, without status migrainosus: Secondary | ICD-10-CM

## 2023-02-15 DIAGNOSIS — F419 Anxiety disorder, unspecified: Secondary | ICD-10-CM

## 2023-02-15 MED ORDER — SUMATRIPTAN SUCCINATE 50 MG PO TABS
50.0000 mg | ORAL_TABLET | ORAL | 0 refills | Status: DC | PRN
Start: 2023-02-15 — End: 2024-01-23

## 2023-02-15 MED ORDER — ESCITALOPRAM OXALATE 10 MG PO TABS: 10.0000 mg | ORAL_TABLET | Freq: Every day | ORAL | 3 refills | Status: DC

## 2023-02-15 MED ORDER — HYDROCORTISONE (PERIANAL) 2.5 % EX CREA: 1.0000 | TOPICAL_CREAM | Freq: Two times a day (BID) | CUTANEOUS | 1 refills | Status: AC

## 2023-02-15 NOTE — Progress Notes (Signed)
BP 116/76   Pulse 94   Temp 97.6 F (36.4 C)   Wt 195 lb 8 oz (88.7 kg)   SpO2 96%   BMI 27.27 kg/m    Subjective:    Patient ID: Ashley Thornton, female    DOB: [birth year], 47 y.o.   MRN: [id number]  HPI: ZEBA CERIO is a 47 y.o. female presenting on 02/15/2023 for Hematochezia   HPI  Pt says she has had BRBPR  in past but never this amount.  She reports hemorrhoid flare up about 2 week.  Then it went away then last Thursday she had about 1/2 c blood with BM.  Her last blood was that Thursday.     She has had a little blood with wiping even without BM.    She says her stool is soft formed.  She is having to push a bit harder since starting vitamins.  She didn't get stool softener yet due to just got paid yesterday.  She has abd pain- she has chronic abdominal pain due to gyn issues but she says this is different.   She has had No vomiting or nausea.   Lexapro is doing better than the paxil in the past.     She has been evaluated by gyn earlier this month for DUB and hydrosalpinx.    Relevant past medical, surgical, family and social history reviewed and updated as indicated. Interim medical history since our last visit reviewed. Allergies and medications reviewed and updated.   Current Outpatient Medications:    cholecalciferol (VITAMIN D3) 25 MCG (1000 UNIT) tablet, Take 1,000 Units by mouth once a week., Disp: , Rfl:    cyclobenzaprine (FLEXERIL) 10 MG tablet, Take 1 tablet (10 mg total) by mouth 2 (two) times daily as needed for muscle spasms., Disp: 20 tablet, Rfl: 0   escitalopram (LEXAPRO) 10 MG tablet, Take 1 tablet (10 mg total) by mouth daily., Disp: 30 tablet, Rfl: 1   ibuprofen (ADVIL) 200 MG tablet, Take 200 mg by mouth every 6 (six) hours as needed., Disp: , Rfl:    Meloxicam (MOBIC PO), Take 1 tablet by mouth as needed., Disp: , Rfl:    SUMAtriptan (IMITREX) 50 MG tablet, Take 1 tablet (50 mg total) by mouth every 2 (two) hours as needed for migraine.  May repeat in 2 hours if headache persists or recurs., Disp: 10 tablet, Rfl: 0   acetaminophen (TYLENOL) 500 MG tablet, Take 500 mg by mouth every 6 (six) hours as needed for mild pain or moderate pain. (Patient not taking: Reported on 01/24/2023), Disp: , Rfl:     Review of Systems  Per HPI unless specifically indicated above     Objective:    BP 116/76   Pulse 94   Temp 97.6 F (36.4 C)   Wt 195 lb 8 oz (88.7 kg)   SpO2 96%   BMI 27.27 kg/m   Wt Readings from Last 3 Encounters:  02/15/23 195 lb 8 oz (88.7 kg)  01/24/23 197 lb 8 oz (89.6 kg)  12/13/22 197 lb (89.4 kg)    Physical Exam Vitals reviewed. Exam conducted with a chaperone present.  Constitutional:      General: She is not in acute distress.    Appearance: She is well-developed. She is not toxic-appearing.  HENT:     Head: Normocephalic and atraumatic.  Cardiovascular:     Rate and Rhythm: Normal rate and regular rhythm.  Pulmonary:     Effort: Pulmonary effort  is normal.     Breath sounds: Normal breath sounds.  Abdominal:     General: Bowel sounds are normal.     Palpations: Abdomen is soft. There is no mass.     Tenderness: There is no abdominal tenderness. There is no guarding or rebound.  Genitourinary:    Rectum: No mass, tenderness or anal fissure.     Comments: (Nurse Berenice assisted) No thrombosed external hemorrhoids Musculoskeletal:     Cervical back: Neck supple.  Lymphadenopathy:     Cervical: No cervical adenopathy.  Skin:    General: Skin is warm and dry.  Neurological:     Mental Status: She is alert and oriented to person, place, and time.  Psychiatric:        Behavior: Behavior normal.           Assessment & Plan:   Encounter Diagnoses  Name Primary?   BRBPR (bright red blood per rectum) Yes   Anxiety    Migraine with aura and without status migrainosus, not intractable      BRBPR -Refer to GI at [facility] in [facility] -pt offered GI in [place] but she would need  to get [facility] financial assistance and she prefers to go to [facility] -encouraged OTC stool softener and plenty water -rx procto-med HC  Mood -Continue lexapro  HCM -pt wants to cancel mammogram appointments through [facility] -Refill imitrex per pt request -pt Declines flu & covid vaccinations  DUB and hydrosalpinx -per gyn  F/u 2 months to recheck mood.  She is to contact office sooner prn

## 2023-03-01 ENCOUNTER — Ambulatory Visit: Payer: Self-pay | Admitting: Physician Assistant

## 2023-03-02 ENCOUNTER — Ambulatory Visit: Payer: Self-pay

## 2023-03-02 ENCOUNTER — Other Ambulatory Visit: Payer: Self-pay

## 2023-04-05 ENCOUNTER — Ambulatory Visit: Payer: Self-pay | Admitting: Physician Assistant

## 2023-04-05 ENCOUNTER — Encounter: Payer: Self-pay | Admitting: Physician Assistant

## 2023-04-05 VITALS — BP 140/92 | HR 87 | Temp 97.4°F | Ht 71.0 in | Wt 199.0 lb

## 2023-04-05 DIAGNOSIS — F419 Anxiety disorder, unspecified: Secondary | ICD-10-CM

## 2023-04-05 MED ORDER — ESCITALOPRAM OXALATE 10 MG PO TABS: 10.0000 mg | ORAL_TABLET | Freq: Every day | ORAL | 3 refills | Status: DC

## 2023-04-05 NOTE — Progress Notes (Signed)
 BP (!) 146/88   Pulse 87   Temp (!) 97.4 F (36.3 C)   Ht 5' 11 (1.803 m)   Wt 199 lb (90.3 kg)   SpO2 99%   BMI 27.75 kg/m    Subjective:    Patient ID: Ashley Thornton, female    DOB: 22-Jan-1976, 48 y.o.   MRN: [id number]  HPI: Ashley Thornton is a 48 y.o. female presenting on 04/05/2023 for Mental Health Problem   HPI  Pt is 47yoF who is in today for recheck mood.  She has Not been taking the lexapro .  She is stressed- rent changes, school starting back next week, work stresses.   Also she has family issues with her mother and grandmother.    She asks about f/u with gyn who she saw in November.  Notes show that f/u was to be determined after review of testing.  Pt has appointment with GI in February for BRBPR.    Relevant past medical, surgical, family and social history reviewed and updated as indicated. Interim medical history since our last visit reviewed. Allergies and medications reviewed and updated.    Current Outpatient Medications:    hydrocortisone  (PROCTO-MED HC ) 2.5 % rectal cream, Place 1 Application rectally 2 (two) times daily., Disp: 28 g, Rfl: 1   ibuprofen (ADVIL) 200 MG tablet, Take 200 mg by mouth every 6 (six) hours as needed., Disp: , Rfl:    acetaminophen (TYLENOL) 500 MG tablet, Take 500 mg by mouth every 6 (six) hours as needed for mild pain or moderate pain. (Patient not taking: Reported on 04/05/2023), Disp: , Rfl:    cholecalciferol (VITAMIN D3) 25 MCG (1000 UNIT) tablet, Take 1,000 Units by mouth once a week. (Patient not taking: Reported on 04/05/2023), Disp: , Rfl:    cyclobenzaprine  (FLEXERIL ) 10 MG tablet, Take 1 tablet (10 mg total) by mouth 2 (two) times daily as needed for muscle spasms. (Patient not taking: Reported on 04/05/2023), Disp: 20 tablet, Rfl: 0   escitalopram  (LEXAPRO ) 10 MG tablet, Take 1 tablet (10 mg total) by mouth daily. (Patient not taking: Reported on 04/05/2023), Disp: 30 tablet, Rfl: 3   Meloxicam  (MOBIC  PO), Take 1 tablet by  mouth as needed. (Patient not taking: Reported on 04/05/2023), Disp: , Rfl:    SUMAtriptan  (IMITREX ) 50 MG tablet, Take 1 tablet (50 mg total) by mouth every 2 (two) hours as needed for migraine. May repeat in 2 hours if headache persists or recurs. (Patient not taking: Reported on 04/05/2023), Disp: 10 tablet, Rfl: 0     Review of Systems  Per HPI unless specifically indicated above     Objective:    BP (!) 146/88   Pulse 87   Temp (!) 97.4 F (36.3 C)   Ht 5' 11 (1.803 m)   Wt 199 lb (90.3 kg)   SpO2 99%   BMI 27.75 kg/m   Wt Readings from Last 3 Encounters:  04/05/23 199 lb (90.3 kg)  02/15/23 195 lb 8 oz (88.7 kg)  01/24/23 197 lb 8 oz (89.6 kg)    Physical Exam Constitutional:      General: She is not in acute distress.    Appearance: She is not toxic-appearing.  HENT:     Head: Normocephalic and atraumatic.  Cardiovascular:     Rate and Rhythm: Normal rate and regular rhythm.  Pulmonary:     Effort: Pulmonary effort is normal. No respiratory distress.     Breath sounds: Normal breath sounds. No  stridor. No wheezing or rhonchi.  Skin:    General: Skin is warm and dry.  Neurological:     Mental Status: She is alert and oriented to person, place, and time.  Psychiatric:        Attention and Perception: Attention normal.        Mood and Affect: Affect is not inappropriate.        Speech: Speech normal.        Behavior: Behavior normal. Behavior is cooperative.           Assessment & Plan:    Encounter Diagnosis  Name Primary?   Anxiety Yes      Mood- pt is ready to get on the escitalopram .  Discussed that effects can take weeks so she should be patient with that.  Dyspareunia- pt recommended to call gyn office to ask about her results and f/u  BRBPR- pt has appointment with GI next month  Pt to follow up 6 weeks.  She is to contact office sooner prn

## 2023-05-18 ENCOUNTER — Encounter: Payer: Self-pay | Admitting: Physician Assistant

## 2023-05-18 ENCOUNTER — Ambulatory Visit: Payer: Self-pay | Admitting: Physician Assistant

## 2023-05-18 DIAGNOSIS — F419 Anxiety disorder, unspecified: Secondary | ICD-10-CM

## 2023-05-18 DIAGNOSIS — N926 Irregular menstruation, unspecified: Secondary | ICD-10-CM

## 2023-05-18 MED ORDER — ESCITALOPRAM OXALATE 20 MG PO TABS
20.0000 mg | ORAL_TABLET | Freq: Every day | ORAL | 4 refills | Status: DC
Start: 2023-05-18 — End: 2023-06-28

## 2023-05-18 NOTE — Progress Notes (Signed)
There were no vitals taken for this visit.   Subjective:    Patient ID: Ashley Thornton, female    DOB: 1975-05-22, 48 y.o.   MRN: [id number]  HPI: Ashley Thornton is a 48 y.o. female presenting on 05/18/2023 for No chief complaint on file.   HPI   This is a telemedicine appointment through [employer].  I connected with  Ashley Thornton on 05/18/23 by a video enabled telemedicine application and verified that I am speaking with the correct person using two identifiers.   I discussed the limitations of evaluation and management by telemedicine. The patient expressed understanding and agreed to proceed.  Pt is at home.  Provider is working at home office.   Pt is 48yoF with routine follow up.  She says the lexapro seems to work good but she has still been having irregular menstrual cycles and this makes her mood worse and fluctuating.    She does not report any HI, SI.    Pt is continuing with work-up by gyn and is scheduled for [place] next week.  Pt has been seen by GI and is scheduled for colonoscopy in May.       Relevant past medical, surgical, family and social history reviewed and updated as indicated. Interim medical history since our last visit reviewed. Allergies and medications reviewed and updated.   Current Outpatient Medications:    cholecalciferol (VITAMIN D3) 25 MCG (1000 UNIT) tablet, Take 1,000 Units by mouth once a week. (Patient not taking: Reported on 04/05/2023), Disp: , Rfl:    escitalopram (LEXAPRO) 10 MG tablet, Take 1 tablet (10 mg total) by mouth daily., Disp: 30 tablet, Rfl: 3   hydrocortisone (PROCTO-MED HC) 2.5 % rectal cream, Place 1 Application rectally 2 (two) times daily., Disp: 28 g, Rfl: 1   ibuprofen (ADVIL) 200 MG tablet, Take 200 mg by mouth every 6 (six) hours as needed., Disp: , Rfl:    SUMAtriptan (IMITREX) 50 MG tablet, Take 1 tablet (50 mg total) by mouth every 2 (two) hours as needed for migraine. May repeat in 2 hours if headache persists or  recurs. (Patient not taking: Reported on 04/05/2023), Disp: 10 tablet, Rfl: 0   Review of Systems  Per HPI unless specifically indicated above     Objective:    There were no vitals taken for this visit.  Wt Readings from Last 3 Encounters:  04/05/23 199 lb (90.3 kg)  02/15/23 195 lb 8 oz (88.7 kg)  01/24/23 197 lb 8 oz (89.6 kg)    Physical Exam Constitutional:      General: She is not in acute distress.    Appearance: She is not toxic-appearing.  HENT:     Head: Normocephalic and atraumatic.  Pulmonary:     Effort: Pulmonary effort is normal. No respiratory distress.     Comments: Pt is talking in complete sentences without dyspnea.  Neurological:     Mental Status: She is alert and oriented to person, place, and time.  Psychiatric:        Behavior: Behavior normal.           Assessment & Plan:    Encounter Diagnoses  Name Primary?   Anxiety Yes   Irregular menses       -will increase lexapro and follow up to recheck in six weeks -discussed bills and financial assistance with pt.  Discussed that she will need to update enrollment in early April. -pt to contact office if needed for anything  prior to her scheduled follow up

## 2023-05-30 ENCOUNTER — Encounter: Payer: Self-pay | Admitting: Physician Assistant

## 2023-05-31 ENCOUNTER — Telehealth: Payer: Self-pay

## 2023-05-31 NOTE — Telephone Encounter (Signed)
 Attempted wellness/follow up call to [facility] client. Her PCP is [facility].  Her next scheduled appointment is :06/29/23.  No answer today, left voicemail requesting return call.   Francee Nodal RN [employer]

## 2023-06-01 ENCOUNTER — Encounter: Payer: Self-pay | Admitting: Physician Assistant

## 2023-06-01 ENCOUNTER — Ambulatory Visit: Payer: Self-pay | Admitting: Physician Assistant

## 2023-06-01 DIAGNOSIS — J069 Acute upper respiratory infection, unspecified: Secondary | ICD-10-CM

## 2023-06-01 NOTE — Progress Notes (Signed)
   There were no vitals taken for this visit.   Subjective:    Patient ID: Ashley Thornton, female    DOB: [birth year], 48 y.o.   MRN: [id number]  HPI: Ashley Thornton is a 48 y.o. female presenting on 06/01/2023 for No chief complaint on file.   HPI  This is a telemedicine appointment through [employer]  I connected with  Ashley Thornton on 06/01/23 by a video enabled telemedicine application and verified that I am speaking with the correct person using two identifiers.   I discussed the limitations of evaluation and management by telemedicine. The patient expressed understanding and agreed to proceed.  Pt is at home. Provider is at office.   Pt says her symptoms Started last Thursday with Nasal congestion.  She had Some itchy throat and ST.   She used Theraflu and mucinex.  She says her ST is gone.  She has Not had any fever.  She  Continues with nasal congestion.   She has had some Blood streaks in nasal congesiton but says that is not unusual for her.  She has had Not much coughing.  She says she has had No sinus pain or pressure like she has had with sinusitis in the past.    She has Not used any antihistamines or decongestants     Relevant past medical, surgical, family and social history reviewed and updated as indicated. Interim medical history since our last visit reviewed. Allergies and medications reviewed and updated.  Review of Systems  Per HPI unless specifically indicated above     Objective:    There were no vitals taken for this visit.  Wt Readings from Last 3 Encounters:  04/05/23 199 lb (90.3 kg)  02/15/23 195 lb 8 oz (88.7 kg)  01/24/23 197 lb 8 oz (89.6 kg)    Physical Exam Constitutional:      General: She is not in acute distress.    Appearance: She is not ill-appearing or toxic-appearing.  HENT:     Head: Normocephalic and atraumatic.  Pulmonary:     Effort: Pulmonary effort is normal. No respiratory distress.     Comments: Pt is talking in complete  sentences without dyspnea Neurological:     Mental Status: She is oriented to person, place, and time.  Psychiatric:        Behavior: Behavior normal.           Assessment & Plan:    Encounter Diagnosis  Name Primary?   Upper respiratory tract infection, unspecified type Yes      -Sudafed and antihistamine like benedryl or claritin -Fluids -She is to contact office if this doesn't improve

## 2023-06-28 ENCOUNTER — Ambulatory Visit: Payer: Self-pay | Admitting: Physician Assistant

## 2023-06-28 ENCOUNTER — Encounter: Payer: Self-pay | Admitting: Physician Assistant

## 2023-06-28 ENCOUNTER — Telehealth: Payer: Self-pay

## 2023-06-28 VITALS — BP 129/82 | HR 81 | Temp 97.3°F | Ht 71.0 in | Wt 197.5 lb

## 2023-06-28 DIAGNOSIS — Z720 Tobacco use: Secondary | ICD-10-CM

## 2023-06-28 DIAGNOSIS — J069 Acute upper respiratory infection, unspecified: Secondary | ICD-10-CM

## 2023-06-28 DIAGNOSIS — F419 Anxiety disorder, unspecified: Secondary | ICD-10-CM

## 2023-06-28 MED ORDER — ESCITALOPRAM OXALATE 20 MG PO TABS: 20.0000 mg | ORAL_TABLET | Freq: Every day | ORAL | 4 refills | Status: DC

## 2023-06-28 MED ORDER — AMOXICILLIN 500 MG PO CAPS: 500.0000 mg | ORAL_CAPSULE | Freq: Three times a day (TID) | ORAL | 0 refills | Status: AC

## 2023-06-28 NOTE — Telephone Encounter (Signed)
 Attempted to return call to [facility] client, she called wanting to schedule an appointment to renew her [facility], [facility] and if needed Financial assistance.  No answer, left message requesting return call.  Francee Nodal RN [employer]

## 2023-06-28 NOTE — Progress Notes (Signed)
 BP 129/82   Pulse 81   Temp (!) 97.3 F (36.3 C)   Ht 5\' 11"  (1.803 m)   Wt 197 lb 8 oz (89.6 kg)   SpO2 99%   BMI 27.55 kg/m    Subjective:    Patient ID: Ashley Thornton, female    DOB: 1975-08-18, 48 y.o.   MRN: [id number]  HPI: Ashley Thornton is a 48 y.o. female presenting on 06/28/2023 for Follow-up   HPI   Pt is 48yoF who is in today for routine follow up.    She says the escitalopram is helping.  She says it takes a lot to get her upset or angry now.    She has been seen by GI and Colonoscopy scheduled for next month.     She has been seen by gyn and had [place].  she was rx OCP but she hasn't picked them up yet.   She says she is supposed to do another [place] in the future.   Pt concerned- 3rd cold since christmas.  She Feels light headed when blinks eyes.  Feels light headed with bending over or truns head.  No pain behind eyes.  She took sudafed yesterday.  Some cough.    Relevant past medical, surgical, family and social history reviewed and updated as indicated. Interim medical history since our last visit reviewed. Allergies and medications reviewed and updated.    Current Outpatient Medications:    escitalopram (LEXAPRO) 20 MG tablet, Take 1 tablet (20 mg total) by mouth daily., Disp: 30 tablet, Rfl: 4   hydrocortisone (PROCTO-MED HC) 2.5 % rectal cream, Place 1 Application rectally 2 (two) times daily., Disp: 28 g, Rfl: 1   ibuprofen (ADVIL) 200 MG tablet, Take 200 mg by mouth every 6 (six) hours as needed., Disp: , Rfl:    SUMAtriptan (IMITREX) 50 MG tablet, Take 1 tablet (50 mg total) by mouth every 2 (two) hours as needed for migraine. May repeat in 2 hours if headache persists or recurs., Disp: 10 tablet, Rfl: 0   cholecalciferol (VITAMIN D3) 25 MCG (1000 UNIT) tablet, Take 1,000 Units by mouth once a week. (Patient not taking: Reported on 06/28/2023), Disp: , Rfl:    Review of Systems  Per HPI unless specifically indicated above     Objective:    BP  129/82   Pulse 81   Temp (!) 97.3 F (36.3 C)   Ht 5\' 11"  (1.803 m)   Wt 197 lb 8 oz (89.6 kg)   SpO2 99%   BMI 27.55 kg/m   Wt Readings from Last 3 Encounters:  06/28/23 197 lb 8 oz (89.6 kg)  04/05/23 199 lb (90.3 kg)  02/15/23 195 lb 8 oz (88.7 kg)    Physical Exam Vitals reviewed.  Constitutional:      General: She is not in acute distress.    Appearance: She is well-developed. She is not toxic-appearing.  HENT:     Head: Normocephalic and atraumatic.     Right Ear: Tympanic membrane, ear canal and external ear normal.     Left Ear: Tympanic membrane, ear canal and external ear normal.     Nose: Congestion and rhinorrhea present.     Right Sinus: No maxillary sinus tenderness or frontal sinus tenderness.     Left Sinus: No maxillary sinus tenderness or frontal sinus tenderness.  Eyes:     Extraocular Movements: Extraocular movements intact.     Conjunctiva/sclera: Conjunctivae normal.     Pupils: Pupils  are equal, round, and reactive to light.  Cardiovascular:     Rate and Rhythm: Normal rate and regular rhythm.  Pulmonary:     Effort: Pulmonary effort is normal.     Breath sounds: Normal breath sounds.  Musculoskeletal:     Cervical back: Neck supple.  Lymphadenopathy:     Cervical: No cervical adenopathy.  Skin:    General: Skin is warm and dry.  Neurological:     Mental Status: She is alert and oriented to person, place, and time.  Psychiatric:        Behavior: Behavior normal.           Assessment & Plan:    Encounter Diagnoses  Name Primary?   Anxiety Yes   Upper respiratory tract infection, unspecified type    Tobacco use      Mood -continue escitalopram  Hydrosalpinx/ovarian cyst -pt to continue with gyn.  Confirmed with pt that she is aware of increased risk of clot in smokers on OCP,especially over 5yo.    BRBPR -pt to continue with GI/colonoscopy  URI -discussed with pt that not getting adequate sleep and smoking can both increase  risk of URI.  In light of her reports of light-headedness with positional changes, will cover with abx for possible sinusitis.    Uninsured -pt reminded to update enrollment with care connect  Pt to follow up 3 months.  She is to contact office sooner prn.

## 2023-06-28 NOTE — Patient Instructions (Signed)
Update enrollment with care connect

## 2023-06-29 ENCOUNTER — Ambulatory Visit: Payer: Self-pay | Admitting: Physician Assistant

## 2023-06-29 ENCOUNTER — Telehealth: Payer: Self-pay

## 2023-06-29 NOTE — Telephone Encounter (Signed)
 Client returned call to [facility] to make and appointment to renew her [facility], [facility] and [facility] FA if applicable.  Client is aware of documents needed to renew. Appointment made for 07/04/23 at 0830 am after she is off work. Will add to fhases schedule. She will bring ID Paystubs and bank Statement. She has not filed taxes this year yet.   Ashley Nodal RN [employer]/[facility]

## 2023-08-17 ENCOUNTER — Telehealth: Payer: Self-pay | Admitting: Physician Assistant

## 2023-08-17 NOTE — Telephone Encounter (Signed)
 Pt Left an Voice Message stating that she need an work, because she has an migraine. I called pt back to make an appointment. Pt denied the appointment.

## 2023-09-27 ENCOUNTER — Ambulatory Visit: Payer: Self-pay | Admitting: Physician Assistant

## 2023-10-17 ENCOUNTER — Telehealth: Payer: Self-pay

## 2023-10-17 NOTE — Telephone Encounter (Signed)
 Attempted follow up call with [facility] client no answer, unable to leave message, will reattempt tomorrow.   Avelina JONELLE Skeen RN [employer]

## 2023-11-02 ENCOUNTER — Ambulatory Visit: Payer: Self-pay | Admitting: Physician Assistant

## 2023-11-02 ENCOUNTER — Encounter: Payer: Self-pay | Admitting: Physician Assistant

## 2023-11-02 VITALS — BP 130/83 | HR 74 | Temp 97.3°F | Ht 71.0 in | Wt 204.8 lb

## 2023-11-02 DIAGNOSIS — M4322 Fusion of spine, cervical region: Secondary | ICD-10-CM

## 2023-11-02 DIAGNOSIS — G4486 Cervicogenic headache: Secondary | ICD-10-CM

## 2023-11-02 NOTE — Progress Notes (Signed)
 BP 130/83   Pulse 74   Temp (!) 97.3 F (36.3 C)   Ht 5' 11 (1.803 m)   Wt 204 lb 12 oz (92.9 kg)   SpO2 98%   BMI 28.56 kg/m    Subjective:    Patient ID: Ashley Thornton, female    DOB: Oct 20, 1975, 48 y.o.   MRN: [id number]  HPI: Ashley Thornton is a 48 y.o. female presenting on 11/02/2023 for Headache (Pt states she woke up last wednesday with her neck hurting, pt states the pain started on the back of her neck and radiated up towards her forehead. The pain lasted until Friday. Pt states she took IBU and Imitrex  which eased the pain. Then it started again last night. Pt mentions she usually gets HA before, during or after her menses.(Her menses ended Saturday 8-2. Pt is concerned if this may be related to screws she has in her neck that were placed in 2019)   HPI  Chief Complaint  Patient presents with   Headache    Pt states she woke up last wednesday with her neck hurting, pt states the pain started on the back of her neck and radiated up towards her forehead. The pain lasted until Friday. Pt states she took IBU and Imitrex  which eased the pain. Then it started again last night. Pt mentions she usually gets HA before, during or after her menses.(Her menses ended Saturday 8-2. Pt is concerned if this may be related to screws she has in her neck that were placed in 2019      Per notes available through Epic, pt had ACDF in 2019.   Surgeon was Dr Burnetta at [facility]. Only MRI neck in Epic is prior to her surgery.   Her last follow up with surgeon was in early 2020. Pt no longer has insurance to be seen at [facility]   Pt says she stopped taking her lexapro  and has been fine without it.   Relevant past medical, surgical, family and social history reviewed and updated as indicated. Interim medical history since our last visit reviewed. Allergies and medications reviewed and updated.  CURRENT MEDS: IBU Imitrex    Review of Systems  Per HPI unless specifically  indicated above     Objective:    BP 130/83   Pulse 74   Temp (!) 97.3 F (36.3 C)   Ht 5' 11 (1.803 m)   Wt 204 lb 12 oz (92.9 kg)   SpO2 98%   BMI 28.56 kg/m   Wt Readings from Last 3 Encounters:  11/02/23 204 lb 12 oz (92.9 kg)  06/28/23 197 lb 8 oz (89.6 kg)  04/05/23 199 lb (90.3 kg)    Physical Exam Constitutional:      General: She is not in acute distress.    Appearance: She is not ill-appearing or toxic-appearing.  HENT:     Head: Normocephalic and atraumatic.  Eyes:     Extraocular Movements: Extraocular movements intact.  Neck:     Comments: Anterior neck with well healed surgical scar Mildly decreased ROM Cardiovascular:     Rate and Rhythm: Normal rate and regular rhythm.  Pulmonary:     Effort: Pulmonary effort is normal. No respiratory distress.     Breath sounds: Normal breath sounds. No wheezing.  Musculoskeletal:     Cervical back: No spinous process tenderness. Decreased range of motion.  Lymphadenopathy:     Cervical: No cervical adenopathy.  Skin:    General: Skin is warm  and dry.  Neurological:     Mental Status: She is alert and oriented to person, place, and time.     Cranial Nerves: No facial asymmetry.     Motor: No weakness or tremor.     Gait: Gait is intact.  Psychiatric:        Attention and Perception: Attention normal.        Speech: Speech normal.        Behavior: Behavior normal. Behavior is cooperative.          Assessment & Plan:   Encounter Diagnoses  Name Primary?   Cervicogenic headache Yes   Cervical vertebral fusion      -Refer ortho for evaluation and treatment.  Will defer imaging to orthopedics as well -she has active [facility] financial assistance -pt scheduled to follow up here in two months. She is to contact office sooner prn

## 2023-12-28 NOTE — Progress Notes (Signed)
 ORTHOPAEDIC SPINE CLINIC NOTE     Garnette HERO. [place], DNP Nurse Practitioner [email or web address] [phone]     Patient Name:Ashley Thornton MRN: [id number] DOB: 1975/11/03  Date: 12/28/2023  PCP: Comer Clotilda Edis, PA  ASSESSMENT:   1. Hx of fusion of cervical spine   2. Neck pain on left side   3. Cervicogenic headache    Assessment & Plan Cervical strain with cervicogenic headache   Cervical strain is likely due to muscle strain in the neck, particularly the upper trapezius, causing cervicogenic headaches. Symptoms began two months ago with two episodes of severe headaches originating from the neck and radiating to the head. Examination shows no red flag findings, and she currently experiences no severe pain or headaches. Order cervical spine x-rays to assess for instability and hardware integrity. Provide home exercises to rehabilitate neck muscles. Prescribe tizanidine for muscle relaxation and headache management, to be used as needed.  Left-sided neck and shoulder pain with muscle tightness   She experiences left-sided neck and shoulder pain with muscle tightness, particularly in the upper trapezius. Pain is exacerbated by certain positions and activities, such as looking down for extended periods. Examination reveals tightness in the left neck muscles. Provide home exercises to address muscle tightness and improve flexibility.  Status post anterior cervical discectomy and fusion (ACDF)   She is status post ACDF surgery in 2019 with cage and screws. Examination shows no evidence of spinal cord involvement or instability. Decreased sensation in the left arm has been present since before and after the surgery and is considered baseline for her. Order cervical spine x-rays to ensure hardware stability and no inappropriate movement.  Bilateral hand numbness and tingling   She experiences bilateral hand numbness and tingling, primarily occurring during  sleep. There are no changes in hand strength or function. Symptoms have been ongoing and are not new. Reassess if symptoms worsen or change.   PROM: MJOA: 17   PLAN and RECOMMENDATIONS:    Options reviewed with patient.  Recommended home exercise program and will provide short course of tizanidine to take as needed for cervicogenic headaches and neck pain.  Future Considerations: - PT - advanced imaging  Patient Instructions  Medications: Take over-the-counter medications as needed and as tolerated We have prescribed tizanidine 4mg . Take as needed as prescribed for neck pain and headaches. Activity: Activities as tolerated. Conservative Care: Home Exercise Program provided today. Surgical Care: No surgical indications at this time. Imaging studies: Cervical AP, lateral, flexion, and extension views (today on the way out). Labs: None Return for re-evaluation with Ortho Spine as needed.   Contact our nursing team via MyChart or telephone with any clinical questions/concerns. Our scheduling team and support staff can be reached at [phone].   Orders Placed This Encounter  Procedures  . XR Cervical Spine AP Lateral and Flexion and Extension   Medications Prescribed Today           tizanidine (ZANAFLEX) 4 MG tablet Take 1 tablet (4 mg total) by mouth every eight (8) hours as needed for up to 15 doses.        SUBJECTIVE:   Chief Complaint: Chief Complaint  Patient presents with  . Neck Pain    Patient  has had previous (ACDF) surgery  in 2019, patient present to clinic today with some discomfort with turning her neck form left to right. Patient did mentioned that she has had previous times where the pain radiated from the left side of her neck to  her shoulders.     History of Present Illness:      12/28/23 0931  PainSc: 3    Ashley Thornton is a 48 y.o. right  handed female with a relevant PMH as listed below seen in consultation at the request of Comer Clotilda Edis,* for evaluation of neck pain. Date of onset: since 09/2023.  History of Present Illness Ashley Thornton is a 48 year old female who presents with neck pain and headaches.  Cervicalgia and headache - Severe neck pain and headaches since July, approximately two months ago - Initial pain originated in the neck and radiated to the occiput, resulting in a severe headache lasting about four days - Headache recurred the following week on the same day, less severe than the initial episode - Current neck and left shoulder pain, worsened after prolonged downward gaze - Burning, tingling, and pain in the neck and left shoulder after long drives - Slight headache on Tuesday, but no recent severe headaches like those in July - No recent fevers or chills  Upper extremity paresthesia - Numbness and tingling in both hands, especially when sleeping - No changes in hand strength or function - Able to feel objects despite paresthesia  Functional status and neurological symptoms - No changes in balance - No changes in urination  Surgical history and prior interventions - History of anterior cervical discectomy and fusion (ACDF) in 2019 with cage and screws placed - Concern about possible hardware loosening post-surgery - No physical therapy since surgery - Cortisone injections in the neck prior to surgery - Currently using ibuprofen for pain management    Current Treatments Previous Treatments  Current Relevant Pain Medications: NSAID: Ibuprofen  Current Physical Therapy: No recent PT  Adjunct Treatments: Activity Modification  In a Pain Clinic: No Prior Relevant Pain Medications:   Prior Physical Therapy: Yes.  Injections:Yes. - cervical injections  Prior Relevant Surgeries: Yes. - C3-C4 ACDF (2019)     Medical History  She  has a past medical history of Anxiety and depression, Chronic back pain, Chronic pain, Ectopic pregnancy (HHS-HCC), HSV-2 infection (2019),  and Panic attack.   Surgical History  She  has a past surgical history that includes Fracture surgery; Salpingectomy (Right, 2006); Anterior cervical discectomy w/ fusion (09/2017); Lumbar laminectomy (2005); Cervical polypectomy (2021); and pr colonoscopy flx dx w/collj spec when pfrmd (N/A, 11/22/2023).   Allergies  Codeine and Other  Medications  She has a current medication list which includes the following prescription(s): ibuprofen, sumatriptan , and tizanidine.  Review of Systems A 10-system review was performed by questionnaire and noted in the electronic chart.  Positives noted/discussed.  Balance of systems was negative.  Fever/chills :denies Recent unexplained weight loss :denies Bowel/bladder symptoms :denies  Family History Her family history includes Alcohol abuse in her maternal grandfather; Cancer in her maternal grandfather and another family member; Dementia in her paternal grandmother; Diabetes in her maternal grandmother; Gout in her maternal uncle; Hypertension in her maternal aunt and mother; Sickle cell anemia in her paternal aunt; Stroke in her maternal uncle.   Social History She  reports that she has been smoking cigars. She has never used smokeless tobacco. She reports current alcohol use. She reports current drug use. Drug: Marijuana.     Occupational History  . Occupation: hemodialysis technician    Comment: [employer]     OBJECTIVE:   PHYSICAL EXAM: Vitals: Temp 37 C (98.6 F) (Temporal)   Ht 180.3 cm (5' 11)   Wt  92.4 kg (203 lb 12.8 oz)   BMI 28.42 kg/m  Appearance: well-nourished and no acute distress  Skin: No cyanosis or clubbing in bilat hands. and No edema in the feet. Pulses: 2+ radial pulses bilaterally  Neurologic exam:  Mental Status: Ashley Thornton is oriented to time, place, and person & is alert and cooperative Motor: Normal bulk and tone without evidence of pronator drift or fasciculations. No abnormal movements. Gait: normal.    Cerebellum/Coordination: Deferred  Motor R L  Reflexes R L  Deltoid 5 5  Tricep 2+ 2+  Bicep 5 5  Bicep 2+ 2+  Tricep 5 5  Brachiorad 2+ 2+  WE 5 5      Grip 5 5  Patellar 2+ 2+  IO 5 5  Achilles 2+ 2+  IP 5 5      Quad 5 5  Pathologic R L  TA 5 5  Hoffmann's neg. neg.  EHL 5 5  Babinski neg. neg.  GS 5 5  Clonus neg. neg.    Sensory:  Cervical Sensory to Light Touch Right  Left   Supraclavicular fossa (C3) Normal Diminished  AC joint (C4)  Normal Diminished  Deltoid (C5) Normal Diminished  Lateral forearm/thumb (C6) Normal Diminished  Middle finger (C7) Normal Diminished  Little finger (C8) Normal Normal  Medial arm (T1) Normal Normal  Sensation to light touch intact throughout lower extremities.   Cervical Spine Exam: Inspection: No swelling, erythema, deformity, atrophy or hypertrophy noted. Well healed incision.  Cervical Spine ROM: mildly restricted Cervical Lymphadenopathy: none Spine Palpation: normal skin, point tender at C6-C7, tender along left upper trap  Facet Loading Test: Deferred Cord/Nerve Tension Signs: Lhermitte's negative  Right Shoulder: Normal elevation ROM and stability. Left Shoulder: Normal elevation ROM and stability.  Right Hand: No atrophy noted upon inspection. Normal ROM and stability. Normal skin. Left Hand: No atrophy noted upon inspection. Normal ROM and stability. Normal skin.     MEDICAL DECISION MAKING  Test Results: Imaging:  C-spine XR. [facility]. Date:Today.  Impression: No acute findings. Stable hardware of C3-C4 ACDF.   Large anterior osteophyte formation prominent at C5-C6  I, Garnette Hamilton, NP, personally interpreted the images. The available reports were reviewed. Results were emailed to patient via MyChart.  Labs: Lab Results  Component Value Date   A1C 4.9 01/21/2019     Discussion: Clinical findings, diagnostic/treatment options, and plan were discussed with the patient. Activities - Advised activities as tolerated,  using pain as a guide. Conservative Care - Options discussed. Medications - risks/benefits of tizanidine were discussed.  E&M Coding:  MEDICAL DECISION MAKING (level of service defined by 2/3 elements)   Number/Complexity of Problems Addressed 1 stable chronic illness (99203/99213)  Amount/Complexity of Data to be Reviewed/Analyzed Independent interpretation of a test performed by another physician/other qualified health care professional (99204/99214)  Risk of Complications/Morbidity/Mortality of Management Prescription Medication (99204/99214)   Or TIME   Total Time for E/M Services on the Date of Encounter N/A      cc: Comer Clotilda Rowland DEWAINE Comer, Clotilda Rowland, [place]  mJOA SCORE  Score  UPPER EXTREMITY MOTOR SUBSCORE (/5) 5 (Description: Normal hand coordination)  LOWER EXTREMITY SUBSCORE (/7) 7 (Description: Normal walking)  UPPER EXTREMITY SENSORY SUBSCORE (/3) 2 (Description: Mild loss of hand sensation)  URINARY FUNCTION SUBSCORE (/3) 3 (Description: Normal urinary function)     TOTAL SCORE (12/28/2023): 17

## 2024-01-02 ENCOUNTER — Ambulatory Visit: Payer: Self-pay | Admitting: Physician Assistant

## 2024-01-02 ENCOUNTER — Encounter: Payer: Self-pay | Admitting: Physician Assistant

## 2024-01-02 VITALS — BP 122/81 | HR 96 | Temp 97.4°F | Ht 71.0 in | Wt 204.0 lb

## 2024-01-02 DIAGNOSIS — G4486 Cervicogenic headache: Secondary | ICD-10-CM

## 2024-01-02 NOTE — Progress Notes (Signed)
 Pulse 96   Temp (!) 97.4 F (36.3 C)   Ht 5' 11 (1.803 m)   SpO2 97%   BMI 28.56 kg/m    Subjective:    Patient ID: Ashley Thornton, female    DOB: 1975-11-20, 48 y.o.   MRN: [id number]  HPI: Ashley Thornton is a 48 y.o. female presenting on 01/02/2024 for Follow-up   HPI  Pt is 48yoF who is in today for routine follow up.  She says she is doing well and her mood is good.  She went back to work full-time and is no longer in school.  She says her job is different, less physical, so that is good.  She is working 12 hour shifts, 7p-7a, which has caused her to have some sleep difficulties.   She has seen orthopedics and neurosurgery for her history of neck surgery and HA  She has seen gastro for BRBPR- colonoscopy revealed internal hemorrhoids with recommendation to repeat colonoscopy in 10 years  She has seen gyn for ovarian cyst and hydrosalpinx  Relevant past medical, surgical, family and social history reviewed and updated as indicated. Interim medical history since our last visit reviewed. Allergies and medications reviewed and updated.   Current Outpatient Medications:    hydrocortisone  (PROCTO-MED HC ) 2.5 % rectal cream, Place 1 Application rectally 2 (two) times daily., Disp: 28 g, Rfl: 1   ibuprofen (ADVIL) 200 MG tablet, Take 200 mg by mouth every 6 (six) hours as needed., Disp: , Rfl:    SUMAtriptan  (IMITREX ) 50 MG tablet, Take 1 tablet (50 mg total) by mouth every 2 (two) hours as needed for migraine. May repeat in 2 hours if headache persists or recurs., Disp: 10 tablet, Rfl: 0   tiZANidine (ZANAFLEX) 4 MG tablet, Take 4 mg by mouth every 6 (six) hours as needed for muscle spasms., Disp: , Rfl:    cholecalciferol (VITAMIN D3) 25 MCG (1000 UNIT) tablet, Take 1,000 Units by mouth once a week. (Patient not taking: Reported on 01/02/2024), Disp: , Rfl:     Review of Systems  Per HPI unless specifically indicated above     Objective:    Pulse 96   Temp (!) 97.4 F  (36.3 C)   Ht 5' 11 (1.803 m)   SpO2 97%   BMI 28.56 kg/m   Wt Readings from Last 3 Encounters:  11/02/23 204 lb 12 oz (92.9 kg)  06/28/23 197 lb 8 oz (89.6 kg)  04/05/23 199 lb (90.3 kg)    Physical Exam Vitals reviewed.  Constitutional:      General: She is not in acute distress.    Appearance: She is well-developed. She is not toxic-appearing.  HENT:     Head: Normocephalic and atraumatic.  Cardiovascular:     Rate and Rhythm: Normal rate and regular rhythm.  Pulmonary:     Effort: Pulmonary effort is normal.     Breath sounds: Normal breath sounds.  Abdominal:     General: Bowel sounds are normal.     Palpations: Abdomen is soft. There is no mass.     Tenderness: There is no abdominal tenderness.  Musculoskeletal:     Cervical back: Neck supple.  Lymphadenopathy:     Cervical: No cervical adenopathy.  Skin:    General: Skin is warm and dry.  Neurological:     Mental Status: She is alert and oriented to person, place, and time.  Psychiatric:        Behavior: Behavior normal.  Assessment & Plan:    Encounter Diagnosis  Name Primary?   Cervicogenic headache Yes     -discussed shift work sleep disorder.  Emphasized that she needed to address this with new provider if she needs to change (she has to update enrollment and thinks she may no longer qualify) -discussed zanaflex sedation and encouraged to use caution -pt declined flu shot -pt is scheduled to follow up in three months. She is to contact office sooner prn

## 2024-01-23 ENCOUNTER — Other Ambulatory Visit: Payer: Self-pay | Admitting: Physician Assistant

## 2024-01-23 ENCOUNTER — Telehealth: Payer: Self-pay | Admitting: Student

## 2024-01-23 DIAGNOSIS — G43109 Migraine with aura, not intractable, without status migrainosus: Secondary | ICD-10-CM

## 2024-01-23 MED ORDER — SUMATRIPTAN SUCCINATE 50 MG PO TABS
50.0000 mg | ORAL_TABLET | ORAL | 0 refills | Status: AC | PRN
Start: 2024-01-23 — End: ?

## 2024-01-23 NOTE — Telephone Encounter (Signed)
 Pt called requesting an appt. Pt states 2 weeks ago she had body aches, chills and felt hot/cold for 1 day, then last week she experienced the same symptoms this time lasting 2 days. Pt now reports having a migraine since yesterday. Pt took Imitrex , which pt reports it helped a lot, but still feels some pain from the migraine. Pt mentions she is grieving the loss of her sister which passed last week.  Per PA's recommendations, pt does not need appt at this time considering her other symptoms are resolved and could have likely been the cause of a virus or other. If she develops these or other symptoms again pt is recommended to contact this office to be scheduled. In regards to her migraine and possible stress that can come from her grief, pt is to continue Imitrex  as needed. Refill sent to [facility] per pt's request. Pt verbalized understanding and is agreeable with this plan.

## 2024-02-13 ENCOUNTER — Ambulatory Visit: Payer: Self-pay | Admitting: Physician Assistant

## 2024-02-13 ENCOUNTER — Encounter: Payer: Self-pay | Admitting: Physician Assistant

## 2024-02-13 VITALS — BP 126/78 | HR 79 | Temp 97.7°F | Ht 71.0 in | Wt 202.0 lb

## 2024-02-13 DIAGNOSIS — J019 Acute sinusitis, unspecified: Secondary | ICD-10-CM

## 2024-02-13 MED ORDER — AMOXICILLIN 500 MG PO CAPS: 500.0000 mg | ORAL_CAPSULE | Freq: Three times a day (TID) | ORAL | 0 refills | Status: AC

## 2024-02-13 NOTE — Progress Notes (Signed)
 BP 126/78   Pulse 79   Temp 97.7 F (36.5 C)   Ht 5' 11 (1.803 m)   Wt 202 lb (91.6 kg)   SpO2 97%   BMI 28.17 kg/m    Subjective:    Patient ID: Ashley Thornton, female    DOB: 17-May-1975, 48 y.o.   MRN: [id number]  HPI: Ashley Thornton is a 48 y.o. female presenting on 02/13/2024 for Headache (And R red eye, for about week.  Pt states she has also had chills, body aches, night sweats off and on for the past month. Pt has taken sudafed which didn't help, dayquil, benadryl, claritin, IBU, pt states she feels little improvement with dayquil. Pt states she has pressure in her cheeks, and pressure. Pt states no mucus or cough, no runny. )   HPI   Chief Complaint  Patient presents with   Headache    And R red eye, for about week.  Pt states she has also had chills, body aches, night sweats off and on for the past month. Pt has taken sudafed which didn't help, dayquil, benadryl, claritin, IBU, pt states she feels little improvement with dayquil. Pt states she has pressure in her cheeks, and pressure. Pt states no mucus or cough, no runny.      She is Feeling much better than she did- HA and body aches. Also some popping in her right ear.     She is having Lots of stress.  Her sister passed away.  She is moving soon. She just started packing this morning.  She has been with out heat since october 10.    This is due to a gas leak.  She is moving december 1.  She is moving to [place].       Relevant past medical, surgical, family and social history reviewed and updated as indicated. Interim medical history since our last visit reviewed. Allergies and medications reviewed and updated.  Review of Systems  Per HPI unless specifically indicated above     Objective:    BP 126/78   Pulse 79   Temp 97.7 F (36.5 C)   Ht 5' 11 (1.803 m)   Wt 202 lb (91.6 kg)   SpO2 97%   BMI 28.17 kg/m   Wt Readings from Last 3 Encounters:  02/13/24 202 lb (91.6 kg)  01/02/24 204 lb (92.5 kg)   11/02/23 204 lb 12 oz (92.9 kg)    Physical Exam Vitals reviewed.  Constitutional:      General: She is not in acute distress.    Appearance: She is well-developed. She is not toxic-appearing.  HENT:     Head: Normocephalic and atraumatic.     Right Ear: Hearing, tympanic membrane, ear canal and external ear normal.     Left Ear: Hearing, tympanic membrane, ear canal and external ear normal.     Nose:     Right Sinus: No maxillary sinus tenderness or frontal sinus tenderness.     Left Sinus: Maxillary sinus tenderness present. No frontal sinus tenderness.     Mouth/Throat:     Pharynx: Uvula midline.  Eyes:     Extraocular Movements: Extraocular movements intact.     Conjunctiva/sclera: Conjunctivae normal.     Pupils: Pupils are equal, round, and reactive to light.  Cardiovascular:     Rate and Rhythm: Normal rate and regular rhythm.  Pulmonary:     Effort: Pulmonary effort is normal.     Breath sounds: Normal breath  sounds. No wheezing.  Musculoskeletal:     Cervical back: Neck supple.  Lymphadenopathy:     Cervical: No cervical adenopathy.  Skin:    General: Skin is warm and dry.  Neurological:     Mental Status: She is alert and oriented to person, place, and time.     Motor: No tremor.     Gait: Gait is intact.  Psychiatric:        Attention and Perception: Attention normal.        Speech: Speech normal.        Behavior: Behavior normal. Behavior is cooperative.           Assessment & Plan:    Encounter Diagnosis  Name Primary?   Acute sinusitis, recurrence not specified, unspecified location Yes      sinusitis -rx amoxil  -continue claritin or benedryl or antihistamine of choice as needed  Stress -discussed possible medication but pt declines  Follow up January as scheduled.  She is to contact office sooner prn

## 2024-04-03 ENCOUNTER — Ambulatory Visit: Payer: Self-pay | Admitting: Physician Assistant
